# Patient Record
Sex: Female | Born: 1940 | Race: White | Hispanic: No | Smoking: Never smoker
Health system: Southern US, Community
[De-identification: ages and names within clinical notes are randomized; demographics above are authoritative.]

## PROBLEM LIST (undated history)

## (undated) DIAGNOSIS — C801 Malignant (primary) neoplasm, unspecified: Secondary | ICD-10-CM

## (undated) DIAGNOSIS — I10 Essential (primary) hypertension: Secondary | ICD-10-CM

## (undated) DIAGNOSIS — E215 Disorder of parathyroid gland, unspecified: Secondary | ICD-10-CM

## (undated) DIAGNOSIS — I639 Cerebral infarction, unspecified: Secondary | ICD-10-CM

## (undated) DIAGNOSIS — I5189 Other ill-defined heart diseases: Secondary | ICD-10-CM

## (undated) HISTORY — PX: MASTECTOMY: SHX3

## (undated) HISTORY — DX: Disorder of parathyroid gland, unspecified: E21.5

## (undated) HISTORY — DX: Cerebral infarction, unspecified: I63.9

## (undated) HISTORY — DX: Other ill-defined heart diseases: I51.89

---

## 2002-06-16 DIAGNOSIS — E215 Disorder of parathyroid gland, unspecified: Secondary | ICD-10-CM

## 2002-06-16 HISTORY — DX: Disorder of parathyroid gland, unspecified: E21.5

## 2004-10-21 ENCOUNTER — Ambulatory Visit: Payer: Self-pay | Admitting: Oncology

## 2004-12-06 ENCOUNTER — Ambulatory Visit: Payer: Self-pay | Admitting: Oncology

## 2005-02-14 ENCOUNTER — Ambulatory Visit: Payer: Self-pay | Admitting: Oncology

## 2005-04-10 ENCOUNTER — Ambulatory Visit: Payer: Self-pay | Admitting: Oncology

## 2005-05-28 ENCOUNTER — Ambulatory Visit: Payer: Self-pay | Admitting: Oncology

## 2005-07-31 ENCOUNTER — Ambulatory Visit: Payer: Self-pay | Admitting: Oncology

## 2005-10-02 ENCOUNTER — Ambulatory Visit: Payer: Self-pay | Admitting: Oncology

## 2005-12-04 ENCOUNTER — Ambulatory Visit: Payer: Self-pay | Admitting: Oncology

## 2005-12-25 ENCOUNTER — Ambulatory Visit: Payer: Self-pay | Admitting: Oncology

## 2006-02-26 ENCOUNTER — Ambulatory Visit: Payer: Self-pay | Admitting: Oncology

## 2006-05-21 ENCOUNTER — Ambulatory Visit: Payer: Self-pay | Admitting: Oncology

## 2006-08-13 ENCOUNTER — Ambulatory Visit: Payer: Self-pay | Admitting: Oncology

## 2006-11-05 ENCOUNTER — Ambulatory Visit: Payer: Self-pay | Admitting: Oncology

## 2007-01-28 ENCOUNTER — Ambulatory Visit: Payer: Self-pay | Admitting: Oncology

## 2017-01-15 DIAGNOSIS — M8580 Other specified disorders of bone density and structure, unspecified site: Secondary | ICD-10-CM | POA: Diagnosis not present

## 2017-01-15 DIAGNOSIS — Z853 Personal history of malignant neoplasm of breast: Secondary | ICD-10-CM | POA: Diagnosis not present

## 2017-02-25 ENCOUNTER — Encounter (HOSPITAL_COMMUNITY)
Admission: EM | Disposition: A | Discharge status: Home Health | Payer: Self-pay | Source: Home / Self Care | Attending: Neurology

## 2017-02-25 ENCOUNTER — Encounter (HOSPITAL_COMMUNITY): Payer: Self-pay | Admitting: Anesthesiology

## 2017-02-25 ENCOUNTER — Inpatient Hospital Stay (HOSPITAL_COMMUNITY)
Admission: EM | Admit: 2017-02-25 | Discharge: 2017-03-02 | DRG: 062 | Disposition: A | Discharge status: Home Health | Payer: Medicare HMO | Attending: Neurology | Admitting: Neurology

## 2017-02-25 ENCOUNTER — Inpatient Hospital Stay (HOSPITAL_COMMUNITY): Payer: Medicare HMO

## 2017-02-25 ENCOUNTER — Emergency Department (HOSPITAL_COMMUNITY): Payer: Medicare HMO

## 2017-02-25 ENCOUNTER — Encounter (HOSPITAL_COMMUNITY): Payer: Self-pay | Admitting: Emergency Medicine

## 2017-02-25 DIAGNOSIS — I712 Thoracic aortic aneurysm, without rupture: Secondary | ICD-10-CM | POA: Diagnosis present

## 2017-02-25 DIAGNOSIS — E785 Hyperlipidemia, unspecified: Secondary | ICD-10-CM | POA: Diagnosis present

## 2017-02-25 DIAGNOSIS — Z9011 Acquired absence of right breast and nipple: Secondary | ICD-10-CM

## 2017-02-25 DIAGNOSIS — I63311 Cerebral infarction due to thrombosis of right middle cerebral artery: Secondary | ICD-10-CM | POA: Diagnosis present

## 2017-02-25 DIAGNOSIS — Z79899 Other long term (current) drug therapy: Secondary | ICD-10-CM

## 2017-02-25 DIAGNOSIS — Z853 Personal history of malignant neoplasm of breast: Secondary | ICD-10-CM | POA: Diagnosis not present

## 2017-02-25 DIAGNOSIS — R414 Neurologic neglect syndrome: Secondary | ICD-10-CM | POA: Diagnosis present

## 2017-02-25 DIAGNOSIS — I700 Atherosclerosis of aorta: Secondary | ICD-10-CM | POA: Diagnosis present

## 2017-02-25 DIAGNOSIS — R29810 Facial weakness: Secondary | ICD-10-CM | POA: Diagnosis present

## 2017-02-25 DIAGNOSIS — R482 Apraxia: Secondary | ICD-10-CM | POA: Diagnosis present

## 2017-02-25 DIAGNOSIS — I639 Cerebral infarction, unspecified: Secondary | ICD-10-CM | POA: Diagnosis not present

## 2017-02-25 DIAGNOSIS — R4189 Other symptoms and signs involving cognitive functions and awareness: Secondary | ICD-10-CM | POA: Diagnosis present

## 2017-02-25 DIAGNOSIS — I5030 Unspecified diastolic (congestive) heart failure: Secondary | ICD-10-CM | POA: Diagnosis present

## 2017-02-25 DIAGNOSIS — M791 Myalgia: Secondary | ICD-10-CM | POA: Diagnosis present

## 2017-02-25 DIAGNOSIS — R4781 Slurred speech: Secondary | ICD-10-CM | POA: Diagnosis present

## 2017-02-25 DIAGNOSIS — I63411 Cerebral infarction due to embolism of right middle cerebral artery: Secondary | ICD-10-CM | POA: Diagnosis present

## 2017-02-25 DIAGNOSIS — Y92230 Patient room in hospital as the place of occurrence of the external cause: Secondary | ICD-10-CM | POA: Diagnosis not present

## 2017-02-25 DIAGNOSIS — X58XXXA Exposure to other specified factors, initial encounter: Secondary | ICD-10-CM | POA: Diagnosis not present

## 2017-02-25 DIAGNOSIS — I459 Conduction disorder, unspecified: Secondary | ICD-10-CM | POA: Diagnosis present

## 2017-02-25 DIAGNOSIS — I638 Other cerebral infarction: Secondary | ICD-10-CM | POA: Diagnosis not present

## 2017-02-25 DIAGNOSIS — I5189 Other ill-defined heart diseases: Secondary | ICD-10-CM

## 2017-02-25 DIAGNOSIS — G8194 Hemiplegia, unspecified affecting left nondominant side: Secondary | ICD-10-CM | POA: Diagnosis present

## 2017-02-25 DIAGNOSIS — R29717 NIHSS score 17: Secondary | ICD-10-CM | POA: Diagnosis present

## 2017-02-25 DIAGNOSIS — S50811A Abrasion of right forearm, initial encounter: Secondary | ICD-10-CM | POA: Diagnosis not present

## 2017-02-25 DIAGNOSIS — I10 Essential (primary) hypertension: Secondary | ICD-10-CM | POA: Diagnosis present

## 2017-02-25 DIAGNOSIS — I6300 Cerebral infarction due to thrombosis of unspecified precerebral artery: Secondary | ICD-10-CM | POA: Diagnosis not present

## 2017-02-25 DIAGNOSIS — I361 Nonrheumatic tricuspid (valve) insufficiency: Secondary | ICD-10-CM

## 2017-02-25 DIAGNOSIS — I519 Heart disease, unspecified: Secondary | ICD-10-CM | POA: Diagnosis not present

## 2017-02-25 DIAGNOSIS — E876 Hypokalemia: Secondary | ICD-10-CM

## 2017-02-25 DIAGNOSIS — T148XXA Other injury of unspecified body region, initial encounter: Secondary | ICD-10-CM

## 2017-02-25 HISTORY — DX: Cerebral infarction, unspecified: I63.9

## 2017-02-25 HISTORY — DX: Malignant (primary) neoplasm, unspecified: C80.1

## 2017-02-25 HISTORY — DX: Essential (primary) hypertension: I10

## 2017-02-25 LAB — I-STAT CHEM 8, ED
BUN: 13 mg/dL (ref 6–20)
CREATININE: 0.7 mg/dL (ref 0.44–1.00)
Calcium, Ion: 1.15 mmol/L (ref 1.15–1.40)
Chloride: 106 mmol/L (ref 101–111)
GLUCOSE: 135 mg/dL — AB (ref 65–99)
HEMATOCRIT: 43 % (ref 36.0–46.0)
HEMOGLOBIN: 14.6 g/dL (ref 12.0–15.0)
Potassium: 3.3 mmol/L — ABNORMAL LOW (ref 3.5–5.1)
Sodium: 141 mmol/L (ref 135–145)
TCO2: 21 mmol/L — AB (ref 22–32)

## 2017-02-25 LAB — COMPREHENSIVE METABOLIC PANEL
ALT: 21 U/L (ref 14–54)
AST: 23 U/L (ref 15–41)
Albumin: 4 g/dL (ref 3.5–5.0)
Alkaline Phosphatase: 87 U/L (ref 38–126)
Anion gap: 10 (ref 5–15)
BUN: 12 mg/dL (ref 6–20)
CALCIUM: 9.3 mg/dL (ref 8.9–10.3)
CO2: 20 mmol/L — ABNORMAL LOW (ref 22–32)
CREATININE: 0.8 mg/dL (ref 0.44–1.00)
Chloride: 108 mmol/L (ref 101–111)
Glucose, Bld: 134 mg/dL — ABNORMAL HIGH (ref 65–99)
Potassium: 3.4 mmol/L — ABNORMAL LOW (ref 3.5–5.1)
SODIUM: 138 mmol/L (ref 135–145)
Total Bilirubin: 0.7 mg/dL (ref 0.3–1.2)
Total Protein: 7 g/dL (ref 6.5–8.1)

## 2017-02-25 LAB — DIFFERENTIAL
BASOS ABS: 0 10*3/uL (ref 0.0–0.1)
Basophils Relative: 0 %
EOS ABS: 0.3 10*3/uL (ref 0.0–0.7)
Eosinophils Relative: 2 %
LYMPHS ABS: 4.5 10*3/uL — AB (ref 0.7–4.0)
LYMPHS PCT: 36 %
Monocytes Absolute: 1.1 10*3/uL — ABNORMAL HIGH (ref 0.1–1.0)
Monocytes Relative: 9 %
NEUTROS ABS: 6.6 10*3/uL (ref 1.7–7.7)
NEUTROS PCT: 53 %

## 2017-02-25 LAB — CBC
HCT: 42.1 % (ref 36.0–46.0)
HEMOGLOBIN: 14.1 g/dL (ref 12.0–15.0)
MCH: 29.9 pg (ref 26.0–34.0)
MCHC: 33.5 g/dL (ref 30.0–36.0)
MCV: 89.4 fL (ref 78.0–100.0)
Platelets: 185 10*3/uL (ref 150–400)
RBC: 4.71 MIL/uL (ref 3.87–5.11)
RDW: 13.3 % (ref 11.5–15.5)
WBC: 12.5 10*3/uL — ABNORMAL HIGH (ref 4.0–10.5)

## 2017-02-25 LAB — PROTIME-INR
INR: 0.92
Prothrombin Time: 12.2 seconds (ref 11.4–15.2)

## 2017-02-25 LAB — I-STAT TROPONIN, ED: TROPONIN I, POC: 0.03 ng/mL (ref 0.00–0.08)

## 2017-02-25 LAB — ECHOCARDIOGRAM COMPLETE: Weight: 2631.41 oz

## 2017-02-25 LAB — MRSA PCR SCREENING: MRSA by PCR: NEGATIVE

## 2017-02-25 LAB — CBG MONITORING, ED: Glucose-Capillary: 130 mg/dL — ABNORMAL HIGH (ref 65–99)

## 2017-02-25 LAB — APTT: APTT: 22 s — AB (ref 24–36)

## 2017-02-25 SURGERY — RADIOLOGY WITH ANESTHESIA
Anesthesia: General

## 2017-02-25 MED ORDER — SODIUM CHLORIDE 0.9 % IV SOLN: 50.0000 mL | Freq: Once | INTRAVENOUS | Status: AC

## 2017-02-25 MED ORDER — ATENOLOL 25 MG PO TABS
12.5000 mg | ORAL_TABLET | Freq: Every day | ORAL | Status: DC
  Administered 2017-02-26 – 2017-03-01 (×4): 12.5 mg via ORAL
  Filled 2017-02-25: qty 0.5
  Filled 2017-02-25: qty 1
  Filled 2017-02-25: qty 0.5
  Filled 2017-02-25: qty 1
  Filled 2017-02-25: qty 0.5
  Filled 2017-02-25 (×2): qty 1

## 2017-02-25 MED ORDER — ACETAMINOPHEN 325 MG PO TABS
650.0000 mg | ORAL_TABLET | ORAL | Status: DC | PRN
  Administered 2017-03-01: 650 mg via ORAL
  Filled 2017-02-25: qty 2

## 2017-02-25 MED ORDER — ACETAMINOPHEN 650 MG RE SUPP: 650.0000 mg | RECTAL | Status: DC | PRN

## 2017-02-25 MED ORDER — DIPHENHYDRAMINE HCL 50 MG/ML IJ SOLN
INTRAMUSCULAR | Status: AC
  Filled 2017-02-25: qty 1

## 2017-02-25 MED ORDER — ALTEPLASE (STROKE) FULL DOSE INFUSION
0.9000 mg/kg | Freq: Once | INTRAVENOUS | Status: AC
  Administered 2017-02-25: 67 mg via INTRAVENOUS
  Filled 2017-02-25: qty 100

## 2017-02-25 MED ORDER — NITROGLYCERIN 1 MG/10 ML FOR IR/CATH LAB
INTRA_ARTERIAL | Status: AC
  Filled 2017-02-25: qty 10

## 2017-02-25 MED ORDER — IOPAMIDOL (ISOVUE-370) INJECTION 76%
INTRAVENOUS | Status: AC
  Filled 2017-02-25: qty 50

## 2017-02-25 MED ORDER — LABETALOL HCL 5 MG/ML IV SOLN: 10.0000 mg | INTRAVENOUS | Status: DC | PRN

## 2017-02-25 MED ORDER — STROKE: EARLY STAGES OF RECOVERY BOOK
Freq: Once | Status: AC
  Filled 2017-02-25 (×2): qty 1

## 2017-02-25 MED ORDER — ONDANSETRON HCL 4 MG/2ML IJ SOLN: 4.0000 mg | Freq: Four times a day (QID) | INTRAMUSCULAR | Status: DC | PRN

## 2017-02-25 MED ORDER — EPTIFIBATIDE 20 MG/10ML IV SOLN
INTRAVENOUS | Status: AC
  Filled 2017-02-25: qty 10

## 2017-02-25 MED ORDER — PANTOPRAZOLE SODIUM 40 MG IV SOLR
40.0000 mg | Freq: Every day | INTRAVENOUS | Status: DC
  Administered 2017-02-25: 40 mg via INTRAVENOUS
  Filled 2017-02-25: qty 40

## 2017-02-25 MED ORDER — ACETAMINOPHEN 160 MG/5ML PO SOLN: 650.0000 mg | ORAL | Status: DC | PRN

## 2017-02-25 MED ORDER — POTASSIUM CHLORIDE 10 MEQ/100ML IV SOLN
10.0000 meq | INTRAVENOUS | Status: DC
  Filled 2017-02-25: qty 100

## 2017-02-25 MED ORDER — SENNOSIDES-DOCUSATE SODIUM 8.6-50 MG PO TABS: 1.0000 | ORAL_TABLET | Freq: Every evening | ORAL | Status: DC | PRN

## 2017-02-25 MED ORDER — POTASSIUM CHLORIDE 10 MEQ/100ML IV SOLN
10.0000 meq | INTRAVENOUS | Status: AC
  Administered 2017-02-25 (×2): 10 meq via INTRAVENOUS
  Filled 2017-02-25: qty 100

## 2017-02-25 MED ORDER — SODIUM CHLORIDE 0.9 % IV SOLN
INTRAVENOUS | Status: AC
  Administered 2017-02-25 – 2017-02-26 (×3): via INTRAVENOUS

## 2017-02-25 NOTE — Consult Note (Signed)
Referring Physician: Dr. Dayna Barker    Chief Complaint: Acute onset of left sided weakness and facial droop  HPI: Zoe Andrade is an 76 y.o. female who presented acutely to the ED via EMS as a Code Stroke with sudden onset of left sided weakness, left facial droop, rightward gaze deviation and slurred speech. She woke up this AM in her USOH. Later in the morning while sitting down at a restaurant with her husband she became unresponsive and then, when she started to speak again, the above symptoms were noted. Symptoms began at 0900. She has no prior history of stroke. She is not on an antiplatelet agent or a blood thinner. Her only home medications are Plendil and Tenormin.    LSN: 0900 tPA Given: Yes  No past medical history on file.  No past surgical history on file.  No family history on file. Social History:  has no tobacco, alcohol, and drug history on file.  Allergies: Allergies not on file  Home Medications:  Tenormin  Plendil  ROS: No headache or chest pain. Other ROS as per HPI. Detailed additional ROS deferred due to acuity of presentation.   Physical Examination: There were no vitals taken for this visit.  HEENT: [place]/AT Lungs: Respirations unlabored Ext: No edema  Neurologic Examination: Mental Status: Alert and oriented to self but not situation. Speech dysarthric with some hesitancy, but is fluent without errors of syntax or grammar. Able to follow all right sided commands. Repetition and naming intact. Left hemineglect. Anosognosia regarding left sided symptoms.  Cranial Nerves: II:  Left sided visual field cut. PERRL.  III,IV, VI: Ptosis not present. Rightward conjugate gaze deviation; unable to volitionally cross midline to left. Able to cross midline to left with doll's eye maneuver. No nystagmus.   V,VII: Left facial droop. Decreased sensation to left side of face.  VIII: hearing intact to questions and commands IX,X: No hypophonia or hoarseness XI: Rightward  head rotation.  XII: Tongue deviates slightly to the right when protruded.  Motor: RUE and RLE 5/5 LUE: Intermittent flaccidity with 0/5 strength fluctuating to 3/5 proximal and 2/5 distal strength with coarse, uncoordinated movements LLE: 3/5 proximal and distal Sensory: Absent temp and FT to LUE and LLE.  Deep Tendon Reflexes:  Normoactive on the right. Mildly hyperactive on the left.  Plantars: Right: downgoing   Left: Equivocal Cerebellar: No ataxia with FNF on right. Unable to perform on the left.  Gait: Unable to assess  CTA of head and neck: 1. Right posterior M2 occlusion with marked attenuation of distal MCA branch vessels. 2. M1 segments are intact bilaterally.  3. Atherosclerotic calcifications at the cavernous internal carotid arteries bilaterally without significant stenoses. 4. Tortuosity of the cervical internal carotid arteries bilaterally without significant stenosis. 5. Mild degenerative changes of the cervical spine.   Assessment: 76 y.o. female presenting with acute left sided weakness, left facial droop, rightward gaze deviation and slurred speech.  1. 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 2. CTA findings concordant with exam 3. Stroke Risk Factors - HTN  Plan: 1. The patient is a candidate for IV tPA, with no contraindications. Risks/benefits discussed with patient and additional questions answered. The patient expressed understanding and wish to proceed with tPA.  2. Post-tPA order set including BP management, frequent neuro checks and repeat CT head in 24 hours 3. HgbA1c, fasting lipid panel 4. MRI of the brain without contrast 5. Echocardiogram 6. Cardiac telemetry 7. PT consult,  OT consult, Speech consult 8. No antiplatelet medications or anticoagulants for at least 24 hours following tPA. ASA can be started if repeat CT at 24 hours is negative for hemorrhagic conversion 9. DVT prophylaxis with  SCDs 10. Start atorvastatin 40 mg po qd. Obtain baseline CK level  Addendum: Rapid improvement in Neurological examination with decrease of NIHSS to 3 following initial tPA bolus. Initially was a potential candidate for endovascular treatment. However, given rapid improvement, risks of treatment are felt to outweigh potential benefits. Consensus agreement after discussion between myself, Dr. Earleen Newport and Dr. Estanislado Pandy to hold off on endovascular treatment for now. May require intervention if Neurological exam deteriorates.   50 minutes spent in the emergent Neurological evaluation and management of this critically ill acute stroke patient.   @Electronically  signed: Dr. Kerney Elbe  02/25/2017, 9:51 AM

## 2017-02-25 NOTE — ED Provider Notes (Addendum)
[facility] DEPT Provider Note   CSN: [id number] Arrival date & time: 02/25/17  0944     History   Chief Complaint No chief complaint on file.   HPI Zoe Andrade is a 76 y.o. female.  Patient presents by EMS as a code stroke. Per husband patient is a 76 year old female who was her normal self this morning when she and her husband went out to get gas and then went to a restaurant. Per his account in the restaurant she suddenly became unresponsive and when she started answering questions she was not acting herself. That was 20-30 minutes prior to arrival.  She is having difficulty moving her left side per her husband.  Due to patient's condition, no prior medical records in our system and husband's emotional distress difficult to get further history at this time.   The history is provided by the spouse. The history is limited by the condition of the patient.    No past medical history on file.  There are no active problems to display for this patient.   No past surgical history on file.  OB History    No data available       Home Medications    Prior to Admission medications   Not on File    Family History No family history on file.  Social History Social History  Substance Use Topics  . Smoking status: Not on file  . Smokeless tobacco: Not on file  . Alcohol use Not on file     Allergies   Patient has no allergy information on record.   Review of Systems Review of Systems  Unable to perform ROS: Mental status change     Physical Exam Updated Vital Signs There were no vitals taken for this visit.  Physical Exam  Constitutional: She appears well-developed and well-nourished. No distress.  HENT:  Head: Normocephalic and atraumatic.  Mouth/Throat: Oropharynx is clear and moist.  Airway intact and no difficulty handling secretions  Eyes: Pupils are equal, round, and reactive to light. Conjunctivae and EOM are normal.  Neck: Normal range of  motion. Neck supple.  Cardiovascular: Normal rate, regular rhythm and intact distal pulses.   No murmur heard. Pulmonary/Chest: Effort normal and breath sounds normal. No respiratory distress. She has no wheezes. She has no rales.  Abdominal: Soft. She exhibits no distension. There is no tenderness. There is no rebound and no guarding.  Musculoskeletal: Normal range of motion. She exhibits no edema or tenderness.  Neurological: She is alert.  Unable to cross midline. Clumsy use of the left upper extremity but is able to lift the left leg now but cannot hold. No aphasia.  Left-sided neglect. Decreased sensation  Skin: Skin is warm and dry. No rash noted. No erythema.  Psychiatric: She has a normal mood and affect. Her behavior is normal.  Nursing note and vitals reviewed.    ED Treatments / Results  Labs (all labs ordered are listed, but only abnormal results are displayed) Labs Reviewed  APTT - Abnormal; Notable for the following:       Result Value   aPTT 22 (*)    All other components within normal limits  CBC - Abnormal; Notable for the following:    WBC 12.5 (*)    All other components within normal limits  DIFFERENTIAL - Abnormal; Notable for the following:    Lymphs Abs 4.5 (*)    Monocytes Absolute 1.1 (*)    All other components within normal limits  COMPREHENSIVE METABOLIC PANEL - Abnormal; Notable for the following:    Potassium 3.4 (*)    CO2 20 (*)    Glucose, Bld 134 (*)    All other components within normal limits  CBG MONITORING, ED - Abnormal; Notable for the following:    Glucose-Capillary 130 (*)    All other components within normal limits  I-STAT CHEM 8, ED - Abnormal; Notable for the following:    Potassium 3.3 (*)    Glucose, Bld 135 (*)    TCO2 21 (*)    All other components within normal limits  PROTIME-INR  I-STAT TROPONIN, ED    EKG  EKG Interpretation None       Radiology Ct Angio Head W Or Wo Contrast  Result Date:  02/25/2017 CLINICAL DATA:  Focal neuro deficit, less than 6 hours, stroke suspected. Acute onset of left-sided weakness beginning at 9 a.m. today. Abnormal CT of the head without contrast. EXAM: CT ANGIOGRAPHY HEAD AND NECK TECHNIQUE: Multidetector CT imaging of the head and neck was performed using the standard protocol during bolus administration of intravenous contrast. Multiplanar CT image reconstructions and MIPs were obtained to evaluate the vascular anatomy. Carotid stenosis measurements (when applicable) are obtained utilizing NASCET criteria, using the distal internal carotid diameter as the denominator. CONTRAST:  50 mL Isovue 370 COMPARISON:  CT head without contrast 02/26/2015. FINDINGS: CTA NECK FINDINGS Aortic arch: A 3 vessel arch configuration is present. Atherosclerotic calcifications are distal to the subclavian artery without significant stenosis or aneurysm. Right carotid system: The right common carotid artery is within normal limits. The right carotid bifurcation is unremarkable. There is tortuosity of the distal cervical right ICA without significant stenosis. Left carotid system: The left common carotid artery is within normal limits. Bifurcation is unremarkable. Mild tortuosity is present in the mid cervical left internal carotid artery without significant stenosis. Vertebral arteries: Vertebral artery is originate from the subclavian arteries bilaterally without significant stenosis. Moderate tortuosity is present proximally without significant stenosis. The vertebral arteries are codominant. There is no significant stenosis of either vertebral artery in the neck. Skeleton: Degenerative endplate changes are most prominent at C5-6 on the right at C3-4 on the left. Vertebral body heights and alignment are maintained. No focal lytic or blastic lesions are present. The patient is edentulous. Other neck: No significant mucosal or submucosal lesions are present. Salivary glands are within normal  limits. There is no significant adenopathy. Upper chest: Mild dependent atelectasis is present at the lung apices bilaterally. No focal nodule or mass lesion is present. There is no significant airspace disease. The superior mediastinum is otherwise unremarkable. Review of the MIP images confirms the above findings CTA HEAD FINDINGS Anterior circulation: Atherosclerotic calcifications are presents within main cavernous internal carotid arteries bilaterally without significant stenosis. The terminal ICA is normal bilaterally. The A1 and M1 segments are intact. A proximal posterior right M2 occlusion is present. This limits opacification of posterior MCA branch vessels. Anterior MCA branch vessels are intact. ACA branch vessels are normal. Left MCA branch vessels are normal. Posterior circulation: The vertebral arteries are codominant. Atherosclerotic changes are present at the dural margins. The vertebrobasilar junction is normal. The basilar artery is normal. Both posterior cerebral arteries originate from basilar tip. PCA branch vessels are within normal limits. Venous sinuses: The dural sinuses are patent. The straight sinus deep cerebral veins are intact. The right transverse sinus is dominant. Anatomic variants: None Delayed phase: Not perform Review of the MIP images confirms the above  findings IMPRESSION: 1. Right posterior M2 occlusion with marked attenuation of distal MCA branch vessels. 2. M1 segments are intact bilaterally. 3. Atherosclerotic calcifications at the cavernous internal carotid arteries bilaterally without significant stenoses. 4. Tortuosity of the cervical internal carotid arteries bilaterally without significant stenosis. 5. Mild degenerative changes of the cervical spine. Dr. Damita Dunnings discussed the findings of this study directly with Dr. Cheral Marker at the time of the exam. Electronically Signed   By: San Morelle M.D.   On: 02/25/2017 10:56   Ct Angio Neck W Or Wo  Contrast  Result Date: 02/25/2017 CLINICAL DATA:  Focal neuro deficit, less than 6 hours, stroke suspected. Acute onset of left-sided weakness beginning at 9 a.m. today. Abnormal CT of the head without contrast. EXAM: CT ANGIOGRAPHY HEAD AND NECK TECHNIQUE: Multidetector CT imaging of the head and neck was performed using the standard protocol during bolus administration of intravenous contrast. Multiplanar CT image reconstructions and MIPs were obtained to evaluate the vascular anatomy. Carotid stenosis measurements (when applicable) are obtained utilizing NASCET criteria, using the distal internal carotid diameter as the denominator. CONTRAST:  50 mL Isovue 370 COMPARISON:  CT head without contrast 02/26/2015. FINDINGS: CTA NECK FINDINGS Aortic arch: A 3 vessel arch configuration is present. Atherosclerotic calcifications are distal to the subclavian artery without significant stenosis or aneurysm. Right carotid system: The right common carotid artery is within normal limits. The right carotid bifurcation is unremarkable. There is tortuosity of the distal cervical right ICA without significant stenosis. Left carotid system: The left common carotid artery is within normal limits. Bifurcation is unremarkable. Mild tortuosity is present in the mid cervical left internal carotid artery without significant stenosis. Vertebral arteries: Vertebral artery is originate from the subclavian arteries bilaterally without significant stenosis. Moderate tortuosity is present proximally without significant stenosis. The vertebral arteries are codominant. There is no significant stenosis of either vertebral artery in the neck. Skeleton: Degenerative endplate changes are most prominent at C5-6 on the right at C3-4 on the left. Vertebral body heights and alignment are maintained. No focal lytic or blastic lesions are present. The patient is edentulous. Other neck: No significant mucosal or submucosal lesions are present. Salivary  glands are within normal limits. There is no significant adenopathy. Upper chest: Mild dependent atelectasis is present at the lung apices bilaterally. No focal nodule or mass lesion is present. There is no significant airspace disease. The superior mediastinum is otherwise unremarkable. Review of the MIP images confirms the above findings CTA HEAD FINDINGS Anterior circulation: Atherosclerotic calcifications are presents within main cavernous internal carotid arteries bilaterally without significant stenosis. The terminal ICA is normal bilaterally. The A1 and M1 segments are intact. A proximal posterior right M2 occlusion is present. This limits opacification of posterior MCA branch vessels. Anterior MCA branch vessels are intact. ACA branch vessels are normal. Left MCA branch vessels are normal. Posterior circulation: The vertebral arteries are codominant. Atherosclerotic changes are present at the dural margins. The vertebrobasilar junction is normal. The basilar artery is normal. Both posterior cerebral arteries originate from basilar tip. PCA branch vessels are within normal limits. Venous sinuses: The dural sinuses are patent. The straight sinus deep cerebral veins are intact. The right transverse sinus is dominant. Anatomic variants: None Delayed phase: Not perform Review of the MIP images confirms the above findings IMPRESSION: 1. Right posterior M2 occlusion with marked attenuation of distal MCA branch vessels. 2. M1 segments are intact bilaterally. 3. Atherosclerotic calcifications at the cavernous internal carotid arteries bilaterally without significant  stenoses. 4. Tortuosity of the cervical internal carotid arteries bilaterally without significant stenosis. 5. Mild degenerative changes of the cervical spine. Dr. Damita Dunnings discussed the findings of this study directly with Dr. Cheral Marker at the time of the exam. Electronically Signed   By: San Morelle M.D.   On: 02/25/2017 10:56   Ct Head  Code Stroke Wo Contrast  Result Date: 02/25/2017 CLINICAL DATA:  Code stroke.  Left-sided weakness, onset at 9 a.m. EXAM: CT HEAD WITHOUT CONTRAST TECHNIQUE: Contiguous axial images were obtained from the base of the skull through the vertex without intravenous contrast. COMPARISON:  None. FINDINGS: Brain: No evidence of acute infarction, hemorrhage, hydrocephalus, extra-axial collection or mass lesion/mass effect. Vascular: Atherosclerotic calcification. Possible dense right M1 segment. Skull: No acute or aggressive finding. Sinuses/Orbits: Left sphenoid sinusitis with retained secretions. Other: Text page with prelim sent 02/25/2017 at 9:58 am to Dr. Cheral Marker. Awaiting call back. ASPECTS [facility] Stroke Program Early CT Score) - Ganglionic level infarction (caudate, lentiform nuclei, internal capsule, insula, M1-M3 cortex): 7 - Supraganglionic infarction (M4-M6 cortex): 3 Total score (0-10 with 10 being normal): 10 IMPRESSION: Possible dense/thrombosed right MCA. ASPECTS is 10. No acute hemorrhage. Electronically Signed   By: Monte Fantasia M.D.   On: 02/25/2017 10:03    Procedures Procedures (including critical care time)  Medications Ordered in ED Medications  iopamidol (ISOVUE-370) 76 % injection (not administered)     Initial Impression / Assessment and Plan / ED Course  I have reviewed the triage vital signs and the nursing notes.  Pertinent labs & imaging results that were available during my care of the patient were reviewed by me and considered in my medical decision making (see chart for details).     Patient presenting as a code stroke in the window LSN approx 74min with significant left-sided deficits and concern for large vessel occlusion. Patient went directly to the CT scanner airway was intact. Patient was found to have a dense thrombosis of her right MCA which was discussed with the patient and her husband and she was taken to IR for thrombi lysis.  NIH of 17. VAN score with  neglect Pt to receive TPA  11:02 AM Pt received first dose of TPA and had significant improvement and IR was cancelled.   CRITICAL CARE Performed by: Blanchie Dessert Total critical care time: 30 minutes Critical care time was exclusive of separately billable procedures and treating other patients. Critical care was necessary to treat or prevent imminent or life-threatening deterioration. Critical care was time spent personally by me on the following activities: development of treatment plan with patient and/or surrogate as well as nursing, discussions with consultants, evaluation of patient's response to treatment, examination of patient, obtaining history from patient or surrogate, ordering and performing treatments and interventions, ordering and review of laboratory studies, ordering and review of radiographic studies, pulse oximetry and re-evaluation of patient's condition.   Final Clinical Impressions(s) / ED Diagnoses   Final diagnoses:  Cerebrovascular accident (CVA) due to thrombosis of right middle cerebral artery [facility])    New Prescriptions New Prescriptions   No medications on file     Blanchie Dessert, MD 02/25/17 1014    Blanchie Dessert, MD 02/25/17 Max Meadows, MD 02/25/17 1102

## 2017-02-25 NOTE — Progress Notes (Signed)
Patient is presenting with significant left facial droop, slight confusion, and slurred speech. BP stable. Dr. Cheral Marker made aware of changes. Will continue to monitor. Thayer Ohm D

## 2017-02-25 NOTE — ED Triage Notes (Signed)
Pt arrives from home via [facility] as code stroke, L sided neglect, facial droop.  EMS reports LSN 0900. Pt sitting on couch with spouse, fell onto floor,  No LOC.  Pt alert on arrival, airway intact.

## 2017-02-25 NOTE — Code Documentation (Addendum)
76yo female arriving to [facility] via [facility] at [phone].  Patient from home where she had returned home from getting gas.  Patient's husband reports she was at her baseline at that time when she went to sit on the couch.  LKW 0900.  She proceeded to suddenly fall off the couch.  EMS was called and assessed left hemiplegia, right gaze preference and slurred speech and activated a code stroke.  Patient with h/o breast cancer, right breast mastectomy and HTN.  Stroke team at the bedside on patient arrival.  Labs drawn and patient cleared for CT by Dr. Dayna Barker.  Patient to CT with team.  CT completed.  NIHSS 17, see documentation for details and code stroke times.  Patient with right gaze preference, left hemianopia, severe left hemiparesis, left sided neglect and absent sensation, facial droop and dysarthria on exam.  VAN+. Foley catheter placed.  Pharmacy at the bedside to mix tPA.  tPA 7mg  bolus given at 1007 over 1 minute followed by 60mg /hr for a total of 67mg  per pharmacy dosing.  CTA head and neck completed showing right posterior M2 occlusion with marked attenuation of distal MCA branch vessels.  Patient reassessed with marked improvement, NIHSS now 4, see documentation for details.  MD decision not to proceed with endovascular intervention at that time d/t patient improvement.  Of note, patient anxious with continuous requests to go home requiring redirection and explanation.  Patient transferred to ED room.  Patient's husband tearful and anxious requiring chaplain support.  Chaplain escorted patient's husband to cafeteria and then to 4N waiting room.  Patient monitored frequently per post-tPA protocol.  NIHSS improved to 1 with only minor facial weakness on exam.  tPA infusion completed and NS flush hung.  Patient transferred to 4N28 by ED RN and Stroke RN.  Patient reassessed at the bedside with [facility], RN, no changes in exam.  Patient with improvement in anxiety and understanding of need for admission.  Bedside  handoff with Brayton Layman, RN.

## 2017-02-25 NOTE — Progress Notes (Signed)
   02/25/17 1500  Clinical Encounter Type  Visited With Patient and family together  Visit Type Follow-up  Referral From Chaplain  Consult/Referral To Chaplain  Spiritual Encounters  Spiritual Needs Emotional  Stress Factors  Patient Stress Factors None identified  Family Stress Factors Family relationships;Exhausted;Major life changes  Had an opportunity to speak with the husband of the PT.  He was having difficulty experiencing his wife in the condition that she was in.  He was thankful that he called the EMS when he did or the PT could have been in worse shape.  Crying, husband expressed how he has spent most of his life with his wife and she was his best friend.  2 daughters were present in the room and working to keep mom still while was putting a line in.  Husband shared memories with chaplain and could tell he was having a hard time with the situation.  I encouraged the husband that his strength was needed for his wife and their daughters.  I spoke with husband about the anticipatory grief he was feeling and maybe his outlook would be different if he anticipated another feeling.

## 2017-02-25 NOTE — ED Provider Notes (Signed)
MSE was initiated and I personally evaluated the patient and placed orders (if any) at  9:47 AM on February 25, 2017.  The patient's airway appears stable, orders placed for Code Stroke to get CT and the remainder of the MSE may be completed by another provider.   Kadasia Kassing, Corene Cornea, MD 02/25/17 [id number]

## 2017-02-25 NOTE — Progress Notes (Signed)
  Echocardiogram 2D Echocardiogram has been performed.  Anayansi Rundquist L Androw 02/25/2017, 2:29 PM

## 2017-02-25 NOTE — Progress Notes (Signed)
Responded to page from Ed to support patient's husband who was sitting in CT conference room tearful and afraid. Per nurse pt arrives from home via [place] as code stroke.  Pt was sitting on couch with spouse suddenly fell onto floor. Pt alert on arrival.  Patient husband Richardson Landry) is having much difficulty coping. Spouse is supported by neighbor who said she would remain with him until daughters arrive from out of state. One daughter coming from [facility] caught in traffic. Both daughters Jeannene Patella [phone] and  Maudie Mercury [phone] or [phone]) are in route to hospital.  EDP and Neurologist spoke with husband and both daughters.  I provided empathetic listening, hospitality and emotional support to patients husband and friend. Supported Biochemist, clinical and facilitated information sharing between staff and family.  Escorted husband to 4N waiting area and made him aware as to what to expect next.  Will pass on to unit chaplain for continued support.   02/25/17 1115  Clinical Encounter Type  Visited With Patient;Family;Patient and family together;Health care provider  Visit Type Initial;Spiritual support;Social support;ED  Referral From Nurse  Spiritual Encounters  Spiritual Needs Emotional  Stress Factors  Patient Stress Factors Health changes  Family Stress Factors Exhausted  Cristopher Peru, [facility], Pager [phone]

## 2017-02-25 NOTE — Progress Notes (Signed)
NeuroInterventional Radiology Pre-Procedure Note  History: 76 yo female arrives via ED with acute dense left sided symptoms.  Baseline is normal (mRS = 0), with baseline NIHSS of 17.    Significant improvement of symptoms with near complete resolution after tPA administration with repeat NIHSS of 3. The improvement occurred in the CT department, on the CT table.    CT ASPECTS: 10. CTA:   .M2 occlusion  Discussed with neurology.  Given new baseline of NIHSS 3, and well within the 6 hour window, will admit to ICU for frequent neuro checks and continue to monitor.    If her neuro exam were to deteriorate above NIHSS of 6, she remains a candidate for mechanical thrombectomy up to 6 hours without advanced imaging.  If deterioration were to occur after 6 hours, advanced neuro imaging with either CT perfusion or MR would be suggested before endovascular treatment.   Discussed with the family.    Call with questions/concerns.   Signed,  Dulcy Fanny. Earleen Newport, DO

## 2017-02-26 ENCOUNTER — Encounter (HOSPITAL_COMMUNITY): Payer: Self-pay | Admitting: Neurology

## 2017-02-26 ENCOUNTER — Inpatient Hospital Stay (HOSPITAL_COMMUNITY): Payer: Medicare HMO

## 2017-02-26 DIAGNOSIS — E785 Hyperlipidemia, unspecified: Secondary | ICD-10-CM

## 2017-02-26 LAB — BASIC METABOLIC PANEL
ANION GAP: 10 (ref 5–15)
BUN: 6 mg/dL (ref 6–20)
CO2: 20 mmol/L — ABNORMAL LOW (ref 22–32)
Calcium: 9.1 mg/dL (ref 8.9–10.3)
Chloride: 105 mmol/L (ref 101–111)
Creatinine, Ser: 0.68 mg/dL (ref 0.44–1.00)
GFR calc Af Amer: >60 mL/min (ref >60)
Glucose, Bld: 105 mg/dL — ABNORMAL HIGH (ref 65–99)
POTASSIUM: 3.5 mmol/L (ref 3.5–5.1)
SODIUM: 135 mmol/L (ref 135–145)

## 2017-02-26 LAB — CBC
HCT: 41.5 % (ref 36.0–46.0)
Hemoglobin: 14.2 g/dL (ref 12.0–15.0)
MCH: 30.5 pg (ref 26.0–34.0)
MCHC: 34.2 g/dL (ref 30.0–36.0)
MCV: 89.1 fL (ref 78.0–100.0)
PLATELETS: 173 10*3/uL (ref 150–400)
RBC: 4.66 MIL/uL (ref 3.87–5.11)
RDW: 13.2 % (ref 11.5–15.5)
WBC: 10.7 10*3/uL — ABNORMAL HIGH (ref 4.0–10.5)

## 2017-02-26 LAB — LIPID PANEL
CHOL/HDL RATIO: 3.6 ratio
Cholesterol: 186 mg/dL (ref 0–200)
HDL: 51 mg/dL (ref >40)
LDL Cholesterol: 115 mg/dL — ABNORMAL HIGH (ref 0–99)
Triglycerides: 101 mg/dL (ref <150)
VLDL: 20 mg/dL (ref 0–40)

## 2017-02-26 LAB — HEMOGLOBIN A1C
Hgb A1c MFr Bld: 5.3 % (ref 4.8–5.6)
MEAN PLASMA GLUCOSE: 105.41 mg/dL

## 2017-02-26 MED ORDER — PANTOPRAZOLE SODIUM 40 MG PO TBEC
40.0000 mg | DELAYED_RELEASE_TABLET | Freq: Every day | ORAL | Status: DC
  Administered 2017-02-26 – 2017-03-01 (×4): 40 mg via ORAL
  Filled 2017-02-26 (×5): qty 1

## 2017-02-26 MED ORDER — ATORVASTATIN CALCIUM 10 MG PO TABS
20.0000 mg | ORAL_TABLET | Freq: Every day | ORAL | Status: DC
  Administered 2017-02-26 – 2017-03-02 (×5): 20 mg via ORAL
  Filled 2017-02-26 (×4): qty 2
  Filled 2017-02-26: qty 1

## 2017-02-26 MED ORDER — SODIUM CHLORIDE 0.9 % IV SOLN
INTRAVENOUS | Status: AC
  Administered 2017-02-26: 20:00:00 via INTRAVENOUS

## 2017-02-26 MED ORDER — ASPIRIN EC 325 MG PO TBEC
325.0000 mg | DELAYED_RELEASE_TABLET | Freq: Every day | ORAL | Status: DC
  Administered 2017-02-26 – 2017-03-01 (×4): 325 mg via ORAL
  Filled 2017-02-26 (×5): qty 1

## 2017-02-26 MED ORDER — DIPHENHYDRAMINE HCL 25 MG PO CAPS
50.0000 mg | ORAL_CAPSULE | Freq: Once | ORAL | Status: AC
  Administered 2017-02-26: 50 mg via ORAL
  Filled 2017-02-26: qty 2

## 2017-02-26 MED ORDER — ENOXAPARIN SODIUM 40 MG/0.4ML ~~LOC~~ SOLN
40.0000 mg | SUBCUTANEOUS | Status: DC
  Administered 2017-02-26 – 2017-03-01 (×4): 40 mg via SUBCUTANEOUS
  Filled 2017-02-26 (×4): qty 0.4

## 2017-02-26 NOTE — Progress Notes (Signed)
Rehab Admissions Coordinator Note:  Patient was screened by Cleatrice Burke for appropriateness for an Inpatient Acute Rehab Consult per PT recommendation.  At this time, we are recommending Inpatient Rehab consult.  Cleatrice Burke 02/26/2017, 3:50 PM  I can be reached at [phone].

## 2017-02-26 NOTE — Progress Notes (Signed)
Occupational Therapy Evaluation Patient Details Name: Zoe Andrade MRN: [id number] DOB: 1940/12/24 Today's Date: 02/26/2017    History of Present Illness (P) 76 y.o. female who presented acutely to the ED via EMS as a Code Stroke with sudden onset of left sided weakness, left facial droop, rightward gaze deviation and slurred speech. baseline NIHSS of 17. tPA given. new baseline of NIHSS 3. MRI + No improvement in the posterior right MCA M2 branch occlusion and other MCA branches; R posterior MCA  infarct, extending posteriorly    Clinical Impression   PTA, pt independent with ADL and mobility and was very active. Pt demonstrates deficits with visualcperceptual skills, cognition, sensorimotor control of LUE in addition to L inattention, possible L visual field deficit in addition to deficits listed below. Pt currently requires mod A with mobility and ADL tasks. Pt is an excellent CIR candidate and has a very supportive family. Will follow acutely to address established goals and facilitate DC to next venue of care.     Follow Up Recommendations  CIR;Supervision/Assistance - 24 hour    Equipment Recommendations  3 in 1 bedside commode    Recommendations for Other Services Rehab consult     Precautions / Restrictions Precautions Precautions: (P) Fall Precaution Comments: (P) poor awareness Restrictions Weight Bearing Restrictions: (P) No      Mobility Bed Mobility Overal bed mobility: (P) Needs Assistance Bed Mobility: (P) Supine to Sit     Supine to sit: (P) Min assist        Transfers Overall transfer level: (P) Needs assistance   Transfers: (P) Sit to/from Stand;Stand Pivot Transfers Sit to Stand: (P) Min assist Stand pivot transfers: (P) Mod assist       General transfer comment: (P) L bias    Balance Overall balance assessment: (P) Needs assistance   Sitting balance-Leahy Scale: (P) Fair   Postural control: (P) Posterior lean;Left lateral lean (when  distracted)   Standing balance-Leahy Scale: (P) Poor Standing balance comment: (P) L bias                           ADL either performed or assessed with clinical judgement   ADL Overall ADL's : Needs assistance/impaired Eating/Feeding: Set up;Sitting   Grooming: Minimal assistance;Standing   Upper Body Bathing: Minimal assistance;Sitting   Lower Body Bathing: Moderate assistance;Sit to/from stand   Upper Body Dressing : Moderate assistance;Sitting   Lower Body Dressing: Moderate assistance;Sit to/from stand   Toilet Transfer: Moderate assistance;Ambulation;Comfort height toilet   Toileting- Clothing Manipulation and Hygiene: Minimal assistance;Sit to/from stand       Functional mobility during ADLs: Moderate assistance;+2 for safety/equipment       Vision Baseline Vision/History: Wears glasses Wears Glasses: At all times Patient Visual Report: No change from baseline Vision Assessment?: Vision impaired- to be further tested in functional context;Yes Eye Alignment: Within Functional Limits Ocular Range of Motion: Within Functional Limits Alignment/Gaze Preference: Within Defined Limits Tracking/Visual Pursuits: Decreased smoothness of horizontal tracking;Decreased smoothness of vertical tracking Saccades: Additional head turns occurred during testing Visual Fields: Impaired-to be further tested in functional context Additional Comments: Missing targets in L visual field during visual field testing     Perception Perception Perception Tested?: Yes Perception Deficits: Inattention/neglect Inattention/Neglect: Impaired- to be further tested in functional context Spatial deficits: Poor awareness of orientation in space; overshooting when reaching for objects   Praxis Praxis Praxis tested?: Deficits Praxis-Other Comments: will further assess; difficulty with organization of  tasks, especially with use of LUE    Pertinent Vitals/Pain Pain Assessment:  No/denies pain Faces Pain Scale: (P) No hurt     Hand Dominance (P) Right   Extremity/Trunk Assessment Upper Extremity Assessment Upper Extremity Assessment: LUE deficits/detail LUE Deficits / Details: (P) generalized weakness but attempting to use functionally; poor proprioception and apparent sensorimotor deficits; Pt reaching for items and not realizing she does not have them in her hand LUE Sensation: (P) decreased light touch;decreased proprioception LUE Coordination: (P) decreased fine motor   Lower Extremity Assessment Lower Extremity Assessment: Defer to PT evaluation   Cervical / Trunk Assessment Cervical / Trunk Assessment: (P) Other exceptions Cervical / Trunk Exceptions: (P) L bias   Communication Communication Communication: (P) No difficulties   Cognition Arousal/Alertness: (P) Awake/alert Behavior During Therapy: (P) Impulsive Overall Cognitive Status: (P) Impaired/Different from baseline Area of Impairment: (P) Attention;Following commands;Safety/judgement;Awareness;Problem solving                   Current Attention Level: (P) Sustained   Following Commands: (P) Follows one step commands consistently Safety/Judgement: (P) Decreased awareness of safety;Decreased awareness of deficits Awareness: (P) Emergent Problem Solving: (P) Slow processing     General Comments       Exercises     Shoulder Instructions      Home Living Family/patient expects to be discharged to:: (P) Private residence Living Arrangements: (P) Spouse/significant other Available Help at Discharge: (P) Family;Available 24 hours/day Type of Home: (P) House Home Access: (P) Stairs to enter Entrance Stairs-Number of Steps: (P) 2 Entrance Stairs-Rails: (P) None Home Layout: (P) One level     Bathroom Shower/Tub: (P) Tub/shower unit   Bathroom Toilet: (P) Standard Bathroom Accessibility: (P) Yes How Accessible: Accessible via walker Home Equipment: (P) Grab bars -  tub/shower      Lives With: (P) Spouse    Prior Functioning/Environment Level of Independence: (P) Independent        Comments: (P) Pt driving, states she has many hobbies and likes to golf        OT Problem List: Decreased strength;Decreased activity tolerance;Impaired balance (sitting and/or standing);Impaired vision/perception;Decreased coordination;Decreased cognition;Decreased safety awareness;Decreased knowledge of use of DME or AE;Impaired sensation;Impaired UE functional use      OT Treatment/Interventions: Self-care/ADL training;Therapeutic exercise;Neuromuscular education;DME and/or AE instruction;Therapeutic activities;Cognitive remediation/compensation;Visual/perceptual remediation/compensation;Patient/family education;Balance training    OT Goals(Current goals can be found in the care plan section) Acute Rehab OT Goals Patient Stated Goal: (P) to go home today OT Goal Formulation: With patient/family Time For Goal Achievement: 03/12/17 Potential to Achieve Goals: Good ADL Goals Pt Will Perform Grooming: with modified independence;sitting Pt Will Perform Upper Body Bathing: with modified independence;sitting Pt Will Perform Lower Body Bathing: with supervision;sit to/from stand Pt Will Perform Upper Body Dressing: with modified independence;sitting Pt Will Perform Lower Body Dressing: with supervision;sit to/from stand Pt Will Transfer to Toilet: with supervision;ambulating  OT Frequency: Min 2X/week   Barriers to D/C:            Co-evaluation PT/OT/SLP Co-Evaluation/Treatment: Yes (partial session)     OT goals addressed during session: ADL's and self-care      AM-PAC PT "6 Clicks" Daily Activity     Outcome Measure Help from another person eating meals?: None Help from another person taking care of personal grooming?: A Little Help from another person toileting, which includes using toliet, bedpan, or urinal?: A Lot Help from another person bathing  (including washing, rinsing, drying)?: A Lot Help from another person  to put on and taking off regular upper body clothing?: A Little Help from another person to put on and taking off regular lower body clothing?: A Lot 6 Click Score: 16   End of Session Equipment Utilized During Treatment: Gait belt Nurse Communication: Mobility status  Activity Tolerance: Patient tolerated treatment well Patient left: in chair;with call bell/phone within reach;with chair alarm set;with family/visitor present  OT Visit Diagnosis: Other abnormalities of gait and mobility (R26.89);Muscle weakness (generalized) (M62.81);Low vision, both eyes (H54.2);Other symptoms and signs involving cognitive function                Time: 1453-1526 OT Time Calculation (min): 33 min Charges:  OT General Charges $OT Visit: 1 Visit OT Evaluation $OT Eval Moderate Complexity: 1 Mod G-Codes:     [facility], OT/L  [phone] 02/26/2017  Zoe Andrade,HILLARY 02/26/2017, 3:39 PM

## 2017-02-26 NOTE — Evaluation (Cosign Needed)
Speech Language Pathology Evaluation Patient Details Name: Zoe Andrade MRN: [id number] DOB: 03-23-1941 Today's Date: 02/26/2017 Time: 3570-1779 SLP Time Calculation (min) (ACUTE ONLY): 21 min  Problem List:  Patient Active Problem List   Diagnosis Date Noted  . 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 Cancer ([place])   . Hypertension    Past Surgical History:  Past Surgical History:  Procedure Laterality Date  . MASTECTOMY     HPI:  Pt is an 76 y.o.femalewho presented acutely to the ED via EMS as a Code Stroke with sudden onset of left sided weakness, left facial droop, rightward gaze deviation and slurred speech. Administered tPA upon arrival on 9/12. No prior history of stroke. Head CT shows right posterior M2 occlusion with marked attenuation of distal MCA branch vessels, M1 segments are intact bilaterally, atherosclerotic calcifications at the cavernous internal carotid arteries bilaterally without significant stenoses, tortuosity of the cervical internal carotid arteries bilaterally   Assessment / Plan / Recommendation Clinical Impression  Pt demonstrates receptive and expressive language within functional limits. Speech is intelligible; characterized by mild lingual weakness and mild left facial asymmetry. Pt exhibits impulsivity when completing tasks and responding to questions. She completed visuospatial tasks on the [facility] with a score of 1/5 which could be representative of visual neglect or the pt not focusing on instructions before attempting response. Pt demonstrates lack of awareness of deficits; does not self-correct despite moderate question cues. High concern for impulsivity and safety awareness; discussed with family. Will f/u for further diagnostic treatment and functional communication.     SLP Assessment  SLP Recommendation/Assessment: Patient needs continued Speech Lanaguage Pathology Services     Follow Up Recommendations  Outpatient SLP    Frequency and Duration min 2x/week  2 weeks      SLP Evaluation Cognition  Overall Cognitive Status: Impaired/Different from baseline Arousal/Alertness: Awake/alert Orientation Level: Oriented X4 Attention: Sustained Sustained Attention: Appears intact Memory: Appears intact Awareness: Impaired Awareness Impairment: Emergent impairment Executive Function: Reasoning;Self Monitoring;Self Correcting Reasoning: Impaired Reasoning Impairment: Verbal basic;Functional basic Self Monitoring: Impaired Self Monitoring Impairment: Verbal basic;Functional basic Self Correcting: Impaired Self Correcting Impairment: Verbal basic;Functional basic Behaviors: Impulsive Safety/Judgment: Impaired       Comprehension  Auditory Comprehension Overall Auditory Comprehension: Appears within functional limits for tasks assessed Commands: Within Functional Limits Conversation: Complex    Expression Expression Primary Mode of Expression: Verbal Verbal Expression Overall Verbal Expression: Appears within functional limits for tasks assessed   Oral / Motor  Oral Motor/Sensory Function Overall Oral Motor/Sensory Function: Mild impairment Facial Symmetry: Abnormal symmetry left Lingual ROM: Reduced left Lingual Strength: Reduced Motor Speech Overall Motor Speech: Impaired Respiration: Within functional limits Phonation: Normal Resonance: Within functional limits Articulation: Impaired Level of Impairment: Phrase Intelligibility: Intelligible (mild lingual weakness) Motor Speech Errors: Unaware   GO                    Aaron Edelman, Student SLP 02/26/2017, 10:02 AM

## 2017-02-26 NOTE — Evaluation (Signed)
Physical Therapy Evaluation Patient Details Name: Zoe Andrade MRN: [id number] DOB: [birth year] Today's Date: 02/26/2017   History of Present Illness  76 y.o. female who presented acutely to the ED via EMS as a Code Stroke with sudden onset of left sided weakness, left facial droop, rightward gaze deviation and slurred speech. baseline NIHSS of 17. tPA given. new baseline of NIHSS 3. MRI + No improvement in the posterior right MCA M2 branch occlusion and other MCA branches; R posterior MCA  infarct, extending posteriorly   Clinical Impression  Pt admitted with above diagnosis. Pt currently with functional limitations due to the deficits listed below (see PT Problem List). At the time of PT eval pt was able to perform transfers and ambulation with +2 assist mainly for balance support and safety. Feel a RW would be appropriate next session. Feel continued rehab at the CIR level would be beneficial in maximizing return to PLOF. Acutely, pt will benefit from skilled PT to increase their independence and safety with mobility to allow discharge to the venue listed below.       Follow Up Recommendations CIR;Supervision/Assistance - 24 hour    Equipment Recommendations  Rolling walker with 5" wheels;3in1 (PT)    Recommendations for Other Services Rehab consult     Precautions / Restrictions Precautions Precautions: Fall Precaution Comments: poor awareness Restrictions Weight Bearing Restrictions: No      Mobility  Bed Mobility Overal bed mobility: Needs Assistance Bed Mobility: Supine to Sit     Supine to sit: Min assist        Transfers Overall transfer level: Needs assistance Equipment used: 2 person hand held assist Transfers: Sit to/from [facility] Sit to Stand: Min assist Stand pivot transfers: Mod assist       General transfer comment: L bias  Ambulation/Gait Ambulation/Gait assistance: Min assist;+2 safety/equipment Ambulation Distance (Feet):  15 Feet Assistive device: 2 person hand held assist Gait Pattern/deviations: Step-through pattern;Decreased stride length;Trunk flexed Gait velocity: Decreased Gait velocity interpretation: Below normal speed for age/gender General Gait Details: Pt consistently leaning and drifting L with ambulation. Reaching out for environmental support with the LUE.   Stairs            Wheelchair Mobility    Modified Rankin (Stroke Patients Only) Modified Rankin (Stroke Patients Only) Pre-Morbid Rankin Score: No symptoms Modified Rankin: Moderately severe disability     Balance Overall balance assessment: Needs assistance   Sitting balance-Leahy Scale: Fair   Postural control: Posterior lean;Left lateral lean (when distracted)   Standing balance-Leahy Scale: Poor Standing balance comment: L bias                             Pertinent Vitals/Pain Pain Assessment: No/denies pain Faces Pain Scale: No hurt    Home Living Family/patient expects to be discharged to:: Private residence Living Arrangements: Spouse/significant other Available Help at Discharge: Family;Available 24 hours/day Type of Home: House Home Access: Stairs to enter Entrance Stairs-Rails: None Entrance Stairs-Number of Steps: 2 Home Layout: One level Home Equipment: Grab bars - tub/shower      Prior Function Level of Independence: Independent         Comments: Pt driving, states she has many hobbies and likes to golf     Hand Dominance   Dominant Hand: Right    Extremity/Trunk Assessment   Upper Extremity Assessment Upper Extremity Assessment: Defer to OT evaluation LUE Deficits / Details: generalized weakness but attempting  to use functionally; poor proprioception and apparent sensorimotor deficits; Pt reaching for items and not realizing she does not have them in her hand LUE Sensation: decreased light touch;decreased proprioception LUE Coordination: decreased fine motor    Lower  Extremity Assessment Lower Extremity Assessment: LLE deficits/detail LLE Deficits / Details: Difficult to fully assess as pt had difficulty with MMT instruction. Feel there is some L sided weakness present however pt not putting forth full effort on the R side, so difficult to get a true sense of weakness on the L compared to baseline.     Cervical / Trunk Assessment Cervical / Trunk Assessment: Other exceptions Cervical / Trunk Exceptions: L bias  Communication   Communication: No difficulties  Cognition Arousal/Alertness: Awake/alert Behavior During Therapy: Impulsive Overall Cognitive Status: Impaired/Different from baseline Area of Impairment: Attention;Following commands;Safety/judgement;Awareness;Problem solving                   Current Attention Level: Sustained   Following Commands: Follows one step commands consistently Safety/Judgement: Decreased awareness of safety;Decreased awareness of deficits Awareness: Emergent Problem Solving: Slow processing General Comments: Pt alert and oriented x4      General Comments      Exercises     Assessment/Plan    PT Assessment Patient needs continued PT services  PT Problem List Decreased strength;Decreased range of motion;Decreased activity tolerance;Decreased balance;Decreased mobility;Decreased coordination;Decreased cognition;Decreased knowledge of use of DME;Decreased safety awareness;Decreased knowledge of precautions;Impaired sensation       PT Treatment Interventions DME instruction;Gait training;Stair training;Functional mobility training;Therapeutic activities;Therapeutic exercise;Neuromuscular re-education;Patient/family education;Cognitive remediation    PT Goals (Current goals can be found in the Care Plan section)  Acute Rehab PT Goals Patient Stated Goal: to go home today PT Goal Formulation: With patient/family Time For Goal Achievement: 03/12/17 Potential to Achieve Goals: Good    Frequency Min  4X/week   Barriers to discharge        Co-evaluation PT/OT/SLP Co-Evaluation/Treatment: Yes Reason for Co-Treatment: To address functional/ADL transfers;For patient/therapist safety;Necessary to address cognition/behavior during functional activity PT goals addressed during session: Mobility/safety with mobility;Balance OT goals addressed during session: ADL's and self-care       AM-PAC PT "6 Clicks" Daily Activity  Outcome Measure Difficulty turning over in bed (including adjusting bedclothes, sheets and blankets)?: A Little Difficulty moving from lying on back to sitting on the side of the bed? : A Little Difficulty sitting down on and standing up from a chair with arms (e.g., wheelchair, bedside commode, etc,.)?: A Little Help needed moving to and from a bed to chair (including a wheelchair)?: A Little Help needed walking in hospital room?: A Little Help needed climbing 3-5 steps with a railing? : A Lot 6 Click Score: 17    End of Session Equipment Utilized During Treatment: Gait belt Activity Tolerance: Patient tolerated treatment well Patient left: in chair;with call bell/phone within reach;with chair alarm set;with nursing/sitter in room;with family/visitor present (OT present) Nurse Communication: Mobility status PT Visit Diagnosis: Unsteadiness on feet (R26.81);Hemiplegia and hemiparesis Hemiplegia - Right/Left: Left Hemiplegia - dominant/non-dominant: Non-dominant Hemiplegia - caused by: Cerebral infarction    Time: 4696-2952 PT Time Calculation (min) (ACUTE ONLY): 21 min   Charges:   PT Evaluation $PT Eval Moderate Complexity: 1 Mod     PT G Codes:        Rolinda Roan, PT, DPT Acute Rehabilitation Services Pager: [phone]   Thelma Comp 02/26/2017, 3:46 PM

## 2017-02-26 NOTE — Progress Notes (Signed)
STROKE TEAM PROGRESS NOTE   HISTORY OF PRESENT ILLNESS (per record) Zoe Andrade is an 76 y.o. female who presented acutely to the ED via EMS as a Code Stroke with sudden onset of left sided weakness, left facial droop, rightward gaze deviation and slurred speech. She woke up this AM in her USOH. Later in the morning while sitting down at a restaurant with her husband she became unresponsive and then, when she started to speak again, the above symptoms were noted. Symptoms began at 0900. She has no prior history of stroke. She is not on an antiplatelet agent or a blood thinner. Her only home medications are Plendil and Tenormin.    LSN: [id number] tPA Given: Yes, administered IV t-PA at 02/25/2017 at [place] Medications:  Tenormin  Plendil  ROS: No headache or chest pain. Other ROS as per HPI. Detailed additional ROS deferred due to acuity of presentation.   Physical Examination: There were no vitals taken for this visit.  HEENT: [place]/AT Lungs: Respirations unlabored Ext: No edema  Patient was administered IV t-PA at 02/25/2017 at 1015.  She was admitted to the neuro ICU for further evaluation and treatment.   SUBJECTIVE (INTERVAL HISTORY) Her husband and two daughters are at the bedside.  The patient is awake, alert, and oriented.  She follow all commands appropriately.  The pt states that the weakness in her extremities quickly resolved after tPA administration.  She has a residual L facial droop, very mild slurring of speech, and some fine motor incoordination/apraxia in her left hand. MRI pending   OBJECTIVE Temp:  [97.6 F (36.4 C)-98.5 F (36.9 C)] 98.5 F (36.9 C) (09/13 1557) Pulse Rate:  [66-90] 72 (09/13 1100) Cardiac Rhythm: Normal sinus rhythm (09/13 1000) Resp:  [14-22] 19 (09/13 1400) BP: (118-157)/(71-110) 134/110 (09/13 1400) SpO2:  [91 %-99 %] 97 % (09/13 1100)  CBC:   Recent Labs Lab 02/25/17 0946 02/25/17 0951 02/26/17 0653  WBC 12.5*  --  10.7*   NEUTROABS 6.6  --   --   HGB 14.1 14.6 14.2  HCT 42.1 43.0 41.5  MCV 89.4  --  89.1  PLT 185  --  174    Basic Metabolic Panel:   Recent Labs Lab 02/25/17 0946 02/25/17 0951 02/26/17 0653  NA 138 141 135  K 3.4* 3.3* 3.5  CL 108 106 105  CO2 20*  --  20*  GLUCOSE 134* 135* 105*  BUN 12 13 6   CREATININE 0.80 0.70 0.68  CALCIUM 9.3  --  9.1    Lipid Panel:     Component Value Date/Time   CHOL 186 02/26/2017 0208   TRIG 101 02/26/2017 0208   HDL 51 02/26/2017 0208   CHOLHDL 3.6 02/26/2017 0208   VLDL 20 02/26/2017 0208   LDLCALC 115 (H) 02/26/2017 0208   HgbA1c:  Lab Results  Component Value Date   HGBA1C 5.3 02/26/2017   Urine Drug Screen: No results found for: LABOPIA, COCAINSCRNUR, LABBENZ, AMPHETMU, THCU, LABBARB  Alcohol Level No results found for: [place] I have personally reviewed the radiological images below and agree with the radiology interpretations.  Ct Angio Head and neck W Or Wo Contrast 02/25/2017 IMPRESSION: 1. Right posterior M2 occlusion with marked attenuation of distal MCA branch vessels. 2. M1 segments are intact bilaterally. 3. Atherosclerotic calcifications at the cavernous internal carotid arteries bilaterally without significant stenoses. 4. Tortuosity of the cervical internal carotid arteries bilaterally without significant stenosis. 5. Mild degenerative changes of  the cervical spine.   Mri and Mra Head Wo Contrast 02/26/2017 IMPRESSION: 1. No improvement in the posterior right MCA M2 branch occlusion and attenuation of other right MCA branches since the CTA yesterday. 2. However, a relatively small posterior right MCA territory core infarct is demonstrated, extending posteriorly in a patchy fashion from the posterior half of the insula. No associated hemorrhage or mass effect. 3. Only mild for age nonspecific white matter signal changes otherwise.   Ct Head Code Stroke Wo Contrast 02/25/2017 IMPRESSION: Possible dense/thrombosed  right MCA. ASPECTS is 10. No acute hemorrhage.    TTE  - Left ventricle: The cavity size was normal. Systolic function was   normal. The estimated ejection fraction was in the range of 60%   to 65%. Wall motion was normal; there were no regional wall   motion abnormalities. Doppler parameters are consistent with   abnormal left ventricular relaxation (grade 1 diastolic   dysfunction). - Left atrium: The atrium was mildly dilated. Anterior-posterior   dimension: 41 mm. Impressions: - No cardiac source of emboli was indentified.  LE venous doppler pending  TEE pending   PHYSICAL EXAM  Temp:  [97.6 F (36.4 C)-98.5 F (36.9 C)] 98.5 F (36.9 C) (09/13 1557) Pulse Rate:  [66-90] 72 (09/13 1100) Resp:  [14-22] 19 (09/13 1400) BP: (118-157)/(71-110) 134/110 (09/13 1400) SpO2:  [91 %-99 %] 97 % (09/13 1100)  General - Well nourished, well developed, in no apparent distress.  Ophthalmologic - Sharp disc margins OU.   Cardiovascular - Regular rate and rhythm.  Mental Status -  Level of arousal and orientation to time, place, and person were intact. Language including expression, naming, repetition, comprehension was assessed and found intact. B/l hand praxia. Fund of Knowledge was assessed and was intact.  Cranial Nerves II - XII - II - left simultagnosia. III, IV, VI - Extraocular movements intact. V - Facial sensation intact bilaterally. VII - right nasolabial fold flattening. VIII - Hearing & vestibular intact bilaterally. X - Palate elevates symmetrically. XI - Chin turning & shoulder shrug intact bilaterally. XII - Tongue protrusion intact.  Motor Strength - The patient's strength was normal in all extremities except left hand dexterity difficulty with mild pronator drift.  Bulk was normal and fasciculations were absent.   Motor Tone - Muscle tone was assessed at the neck and appendages and was normal.  Reflexes - The patient's reflexes were 1+ in all extremities and  she had no pathological reflexes.  Sensory - Light touch, temperature/pinprick were assessed and were symmetrical. However, with simultaneous stimulation bilaterally, pt neglect left side.  Coordination - The patient had normal movements in the hands with no ataxia or dysmetria.  Tremor was absent.  Gait and Station - deferred   ASSESSMENT/PLAN Ms. Rox Mcgriff is a 76 y.o. female with history of HTN presenting with left sided weakness and right gaze, slurry speech. She received IV t-PA and symptoms much improved.   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 Yes; Fluid consistency: Thin Diet NPO time specified  No antithrombotic prior to admission, now on aspirin 325 mg daily  Patient counseled to be compliant with her antithrombotic medications  Ongoing aggressive stroke risk factor management  Therapy recommendations:  pending  Disposition:  pending  Hypertension  Stable  Permissive hypertension (OK if < 180/105) but gradually normalize in 5-7 days  Long-term BP goal normotensive  Hyperlipidemia  Home meds:  none  LDL 115, goal < 70  Add lipitor 20  Continue statin at discharge  Other Stroke Risk Factors  Advanced age  Other [facility] day # 1  This patient is critically ill due to right MCA stroke, hypertension, status post TPA and at significant risk of neurological worsening, death form recurrent stroke, hemorrhagic conversion, bleeding. This patient's care requires constant monitoring of vital signs, hemodynamics,  respiratory and cardiac monitoring, review of multiple databases, neurological assessment, discussion with family, other specialists and medical decision making of high complexity. I spent 40 minutes of neurocritical care time in the care of this patient. I had long discussion with husband and daughters at bedside, updated pt current condition, treatment plan and potential prognosis. They expressed understanding and appreciation.  Rosalin Hawking, MD PhD Stroke Neurology 02/26/2017 4:37 PM  To contact Stroke Continuity provider, please refer to [email or web address] After hours, contact General Neurology

## 2017-02-26 NOTE — Progress Notes (Signed)
Pt was in bed attempting to drink a cup of coffee. Pt stated that the bedside table was too close to the bed and caused the coffee to spill into bed. Pt likely got out of bed once coffee was spilled, being that it was very hot. Pt did not hit head, landed on bottom. Area assessed no bruising or redness. Pt does not complain of pain. VS within normal limits. MD notified, no new orders given. Attempted to call pt family unsuccessfully. Will continue to monitor. Fortino Sic, RN, BSN 02/26/2017 7:07 AM

## 2017-02-27 ENCOUNTER — Encounter (HOSPITAL_COMMUNITY): Payer: Medicare HMO

## 2017-02-27 DIAGNOSIS — I10 Essential (primary) hypertension: Secondary | ICD-10-CM

## 2017-02-27 DIAGNOSIS — I519 Heart disease, unspecified: Secondary | ICD-10-CM

## 2017-02-27 DIAGNOSIS — I63411 Cerebral infarction due to embolism of right middle cerebral artery: Principal | ICD-10-CM

## 2017-02-27 DIAGNOSIS — I63311 Cerebral infarction due to thrombosis of right middle cerebral artery: Secondary | ICD-10-CM

## 2017-02-27 DIAGNOSIS — I5189 Other ill-defined heart diseases: Secondary | ICD-10-CM

## 2017-02-27 LAB — CBC
HCT: 40 % (ref 36.0–46.0)
HEMOGLOBIN: 13.4 g/dL (ref 12.0–15.0)
MCH: 30.2 pg (ref 26.0–34.0)
MCHC: 33.5 g/dL (ref 30.0–36.0)
MCV: 90.3 fL (ref 78.0–100.0)
Platelets: 170 10*3/uL (ref 150–400)
RBC: 4.43 MIL/uL (ref 3.87–5.11)
RDW: 13.5 % (ref 11.5–15.5)
WBC: 9.2 10*3/uL (ref 4.0–10.5)

## 2017-02-27 LAB — BASIC METABOLIC PANEL
ANION GAP: 8 (ref 5–15)
BUN: 9 mg/dL (ref 6–20)
CHLORIDE: 108 mmol/L (ref 101–111)
CO2: 22 mmol/L (ref 22–32)
Calcium: 9 mg/dL (ref 8.9–10.3)
Creatinine, Ser: 0.73 mg/dL (ref 0.44–1.00)
GFR calc non Af Amer: >60 mL/min (ref >60)
Glucose, Bld: 96 mg/dL (ref 65–99)
POTASSIUM: 3.5 mmol/L (ref 3.5–5.1)
Sodium: 138 mmol/L (ref 135–145)

## 2017-02-27 LAB — GLUCOSE, CAPILLARY
GLUCOSE-CAPILLARY: 95 mg/dL (ref 65–99)
GLUCOSE-CAPILLARY: 98 mg/dL (ref 65–99)

## 2017-02-27 NOTE — Progress Notes (Addendum)
Inpatient Rehabilitation  Reviewed most recent PT note that shows progress with a continued significant fall risk.  Met with patient, spouse, and daughter at bedside to discuss team's recommendation for IP Rehab.  Shared booklets and answered questions.  They are also concerned with her fall risk and would like to get IP Rehab in order for patient to maximize her functional independence prior to returning home with spouse, who is unable to physically help her.  Will proceed with initiating insurance authorization.  Will follow up with the team Monday, 03/02/17 regarding medical readiness, insurance authorization, and IP Rehab bed availability.  Please call with questions.   Carmelia Roller., CCC/SLP Admission Coordinator  [place]  Cell [phone]

## 2017-02-27 NOTE — Progress Notes (Signed)
Patient had an assisted fall was assisted to bathroom by rn and as she got up the gown got stack under the walker thus making her to lean to the left side which was weak and the RN assisted her to knee down on the floor. No apparent injury noted . MD on call made aware and pt daughter Olin Hauser also notified. All fall precautions measures were in place  VS stable.

## 2017-02-27 NOTE — Progress Notes (Addendum)
Inpatient Rehabilitation  Per Dr. Serita Grit consult I await updated PT notes from today in order to determine if therapy needs continue; however, note that patient had an assisted fall during self-care today with nursing.  Plan to initiate insurance authorization following PT visit today if appropriate.  Please call with questions.   Carmelia Roller., CCC/SLP Admission Coordinator  [place]  Cell [phone]

## 2017-02-27 NOTE — Progress Notes (Signed)
    [facility] has been requested to perform a transesophageal echocardiogram on [name] for Stroke.  After careful review of history and examination, the risks and benefits of transesophageal echocardiogram have been explained including risks of esophageal damage, perforation (1:10,000 risk), bleeding, pharyngeal hematoma as well as other potential complications associated with conscious sedation including aspiration, arrhythmia, respiratory failure and death. Alternatives to treatment were discussed, questions were answered. Patient is willing to proceed.  TEE - Dr. Meda Coffee  @ 1500 . NPO after midnight. Meds with sips.   Emmalyn Hinson, PA-C 02/27/2017 3:59 PM

## 2017-02-27 NOTE — Progress Notes (Signed)
Physical Therapy Treatment Patient Details Name: Zoe Andrade MRN: [id number] DOB: 12-Jun-1941 Today's Date: 02/27/2017    History of Present Illness 76 y.o. female who presented acutely to the ED via EMS as a Code Stroke with sudden onset of left sided weakness, left facial droop, rightward gaze deviation and slurred speech. baseline NIHSS of 17. tPA given. new baseline of NIHSS 3. MRI + No improvement in the posterior right MCA M2 branch occlusion and other MCA branches; R posterior MCA  infarct, extending posteriorly     PT Comments    Pt demo good progress with return of strength LUE/LE. Pt continues to demo slight facial droop L. Proprioceptive deficits noted LLE as well as difficulty finding/maintaining midline in sitting and standing.  Impulsivity, decreased awareness of deficits and poor safety awareness are her greatest barriers for return home. This combined with her mobility deficits puts her at great risk for falls. She has had 2 falls in the hospital, one yesterday and one this AM. She is currently needing a RW for ambulation. In her home environment, the RW could result in a greater fall risk as the pt did not use AD at her baseline. Pt is a good rehab candidate for further progress toward independent level to ensure a safe transition home.    Follow Up Recommendations  CIR;Supervision/Assistance - 24 hour     Equipment Recommendations  Rolling walker with 5" wheels;3in1 (PT)    Recommendations for Other Services Rehab consult     Precautions / Restrictions Precautions Precautions: Fall Precaution Comments: poor awareness    Mobility  Bed Mobility         Supine to sit: Min guard     General bed mobility comments: min guard for safety  Transfers   Equipment used: Rolling walker (2 wheeled) Transfers: Sit to/from [facility] Sit to Stand: Min assist Stand pivot transfers: Min assist       General transfer comment: increased time to  stabilize initial standing balance, verbal cues for sequencing and safety  Ambulation/Gait Ambulation/Gait assistance: Min assist Ambulation Distance (Feet): 25 Feet Assistive device: Rolling walker (2 wheeled) Gait Pattern/deviations: Step-through pattern;Decreased stride length;Decreased weight shift to left Gait velocity: Decreased Gait velocity interpretation: Below normal speed for age/gender General Gait Details: assist with maneuvering through turns and obstacles. Poor safety awareness, lifting all 4 legs of RW off floor for turning. Decreased proprioception LLE.   Stairs            Wheelchair Mobility    Modified Rankin (Stroke Patients Only) Modified Rankin (Stroke Patients Only) Pre-Morbid Rankin Score: No symptoms Modified Rankin: Moderately severe disability     Balance     Sitting balance-Leahy Scale: Fair   Postural control: Posterior lean   Standing balance-Leahy Scale: Poor Standing balance comment: midline shift toward right in stance and posterio lean in sitting                            Cognition Arousal/Alertness: Awake/alert Behavior During Therapy: Impulsive Overall Cognitive Status: Impaired/Different from baseline Area of Impairment: Attention;Following commands;Safety/judgement;Awareness;Problem solving                   Current Attention Level: Sustained   Following Commands: Follows one step commands consistently;Follows multi-step commands inconsistently;Follows multi-step commands with increased time Safety/Judgement: Decreased awareness of safety;Decreased awareness of deficits Awareness: Emergent Problem Solving: Slow processing;Difficulty sequencing General Comments: Pt alert and oriented x4  Exercises General Exercises - Lower Extremity Mini-Sqauts: 5 reps;Standing;AAROM (assist to control ascent/descent and prevent L knee buckling)    General Comments        Pertinent Vitals/Pain Pain Assessment:  No/denies pain    Home Living                      Prior Function            PT Goals (current goals can now be found in the care plan section) Acute Rehab PT Goals Patient Stated Goal: home PT Goal Formulation: With patient/family Time For Goal Achievement: 03/12/17 Potential to Achieve Goals: Good Progress towards PT goals: Progressing toward goals    Frequency    Min 4X/week      PT Plan Current plan remains appropriate    Co-evaluation              AM-PAC PT "6 Clicks" Daily Activity  Outcome Measure  Difficulty turning over in bed (including adjusting bedclothes, sheets and blankets)?: None Difficulty moving from lying on back to sitting on the side of the bed? : A Little Difficulty sitting down on and standing up from a chair with arms (e.g., wheelchair, bedside commode, etc,.)?: A Little Help needed moving to and from a bed to chair (including a wheelchair)?: A Little Help needed walking in hospital room?: A Little Help needed climbing 3-5 steps with a railing? : A Lot 6 Click Score: 18    End of Session Equipment Utilized During Treatment: Gait belt Activity Tolerance: Patient tolerated treatment well Patient left: in chair;with call bell/phone within reach;with family/visitor present Nurse Communication: Mobility status PT Visit Diagnosis: Unsteadiness on feet (R26.81);Hemiplegia and hemiparesis Hemiplegia - Right/Left: Left Hemiplegia - dominant/non-dominant: Non-dominant Hemiplegia - caused by: Cerebral infarction     Time: 5027-7412 PT Time Calculation (min) (ACUTE ONLY): 49 min  Charges:  $Gait Training: 8-22 mins $Therapeutic Activity: 23-37 mins                    G Codes:       Lorrin Goodell, PT  Office # [phone] Pager [phone]    Lorriane Shire 02/27/2017, 11:46 AM

## 2017-02-27 NOTE — Consult Note (Signed)
Physical Medicine and Rehabilitation Consult Reason for Consult: Left side weakness with slurred speech Referring Physician: Dr.Xu   HPI: Zoe Andrade is a 76 y.o. right handed female with history of hypertension. Per chart review and patient, patient lives with spouse. Independent prior to admission. One level home with 2 steps to entry. Presented 02/25/2017 with left-sided weakness and facial droop of acute onset. Cranial CT scan showed possible dense thrombosed right MCA. No acute hemorrhage. Patient did receive TPA. CT Angio head and neck showed right posterior M2 occlusion with marked attenuation of distal MCA branch vessels. Echocardiogram with ejection fraction of 51% grade 1 diastolic dysfunction. No source of emboli. MRI reviewed, showing right MCA infarction. No associated hemorrhage or mass effect. Neurology consulted currently on aspirin for CVA prophylaxis. Subcutaneous Lovenox for DVT prophylaxis. Await venous Doppler studies lower extremities as well as TEE.   Review of Systems  Constitutional: Negative for chills and fever.  Eyes: Negative for blurred vision and double vision.  Respiratory: Negative for shortness of breath.   Cardiovascular: Negative for chest pain, palpitations and leg swelling.  Gastrointestinal: Positive for constipation. Negative for nausea and vomiting.  Genitourinary: Negative for dysuria and flank pain.  Musculoskeletal: Positive for myalgias.  Skin: Negative for rash.  Neurological: Positive for speech change. Negative for seizures.       +FMC difficulty  All other systems reviewed and are negative.  Past Medical History:  Diagnosis Date  . Cancer ([place])   . Hypertension    Past Surgical History:  Procedure Laterality Date  . MASTECTOMY Right    No pertinent family history of premature CVA. Social History:  reports that she has never smoked. She has never used smokeless tobacco. She reports that she does not drink alcohol or use  drugs. Allergies: No Known Allergies Medications Prior to Admission  Medication Sig Dispense Refill  . atenolol (TENORMIN) 50 MG tablet Take 50 mg by mouth daily.    . felodipine (PLENDIL) 10 MG 24 hr tablet Take 10 mg by mouth daily.      Home: Home Living Family/patient expects to be discharged to:: Private residence Living Arrangements: Spouse/significant other Available Help at Discharge: Family, Available 24 hours/day Type of Home: House Home Access: Stairs to enter [employer] of Steps: 2 Entrance Stairs-Rails: None Home Layout: One level Bathroom Shower/Tub: Chiropodist: Standard Bathroom Accessibility: Yes Home Equipment: Grab bars - tub/shower  Lives With: Spouse  Functional History: Prior Function Level of Independence: Independent Comments: Pt driving, states she has many hobbies and likes to golf Functional Status:  Mobility: Bed Mobility Overal bed mobility: Needs Assistance Bed Mobility: Supine to Sit Supine to sit: Min assist Transfers Overall transfer level: Needs assistance Equipment used: 2 person hand held assist Transfers: Sit to/from Stand, Stand Pivot Transfers Sit to Stand: Min assist Stand pivot transfers: Mod assist General transfer comment: L bias Ambulation/Gait Ambulation/Gait assistance: Min assist, +2 safety/equipment Ambulation Distance (Feet): 15 Feet Assistive device: 2 person hand held assist Gait Pattern/deviations: Step-through pattern, Decreased stride length, Trunk flexed General Gait Details: Pt consistently leaning and drifting L with ambulation. Reaching out for environmental support with the LUE.  Gait velocity: Decreased Gait velocity interpretation: Below normal speed for age/gender    ADL: ADL Overall ADL's : Needs assistance/impaired Eating/Feeding: Set up, Sitting Grooming: Minimal assistance, Standing Upper Body Bathing: Minimal assistance, Sitting Lower Body Bathing: Moderate  assistance, Sit to/from stand Upper Body Dressing : Moderate assistance, Sitting Lower  Body Dressing: Moderate assistance, Sit to/from stand Toilet Transfer: Moderate assistance, Ambulation, Comfort height toilet Toileting- Clothing Manipulation and Hygiene: Minimal assistance, Sit to/from stand Functional mobility during ADLs: Moderate assistance, +2 for safety/equipment  Cognition: Cognition Overall Cognitive Status: Impaired/Different from baseline Arousal/Alertness: Awake/alert Orientation Level: Oriented X4 Attention: Sustained Sustained Attention: Appears intact Memory: Appears intact Awareness: Impaired Awareness Impairment: Emergent impairment Executive Function: Reasoning, Self Monitoring, Self Correcting Reasoning: Impaired Reasoning Impairment: Verbal basic, Functional basic Self Monitoring: Impaired Self Monitoring Impairment: Verbal basic, Functional basic Self Correcting: Impaired Self Correcting Impairment: Verbal basic, Functional basic Behaviors: Impulsive Safety/Judgment: Impaired Cognition Arousal/Alertness: Awake/alert Behavior During Therapy: Impulsive Overall Cognitive Status: Impaired/Different from baseline Area of Impairment: Attention, Following commands, Safety/judgement, Awareness, Problem solving Current Attention Level: Sustained Following Commands: Follows one step commands consistently Safety/Judgement: Decreased awareness of safety, Decreased awareness of deficits Awareness: Emergent Problem Solving: Slow processing General Comments: Pt alert and oriented x4  Blood pressure 125/74, pulse 63, temperature 98.4 F (36.9 C), temperature source Oral, resp. rate 20, height 5\' 9"  (1.753 m), weight 74.5 kg (164 lb 4.8 oz), SpO2 98 %. Physical Exam  Vitals reviewed. Constitutional: She is oriented to person, place, and time. She appears well-developed and well-nourished.  HENT:  Head: Normocephalic and atraumatic.  Eyes: EOM are normal. Right eye  exhibits no discharge. Left eye exhibits no discharge.  Pupils reactive to light  Neck: Normal range of motion. Neck supple. No thyromegaly present.  Cardiovascular: Normal rate and regular rhythm.   Respiratory: Effort normal and breath sounds normal. No respiratory distress.  GI: Soft. Bowel sounds are normal. She exhibits no distension.  Musculoskeletal: She exhibits no edema or tenderness.  Neurological: She is alert and oriented to person, place, and time.  She does follow simple commands.  Mild dysarthric speech.  Left facial weakness Motor: RUE/RLE: 5/5 proximal to distal.  LUE/LLE: 5-/5 proximal to distal. Mild ataxia, but able to correct. Sensation intact to light touch  Skin: Skin is warm and dry.  Psychiatric: She has a normal mood and affect. Her behavior is normal.    Results for orders placed or performed during the hospital encounter of 02/25/17 (from the past 24 hour(s))  CBC     Status: Abnormal   Collection Time: 02/26/17  6:53 AM  Result Value Ref Range   WBC 10.7 (H) 4.0 - 10.5 K/uL   RBC 4.66 3.87 - 5.11 MIL/uL   Hemoglobin 14.2 12.0 - 15.0 g/dL   HCT 41.5 36.0 - 46.0 %   MCV 89.1 78.0 - 100.0 fL   MCH 30.5 26.0 - 34.0 pg   MCHC 34.2 30.0 - 36.0 g/dL   RDW 13.2 11.5 - 15.5 %   Platelets 173 150 - 400 K/uL  Basic metabolic panel     Status: Abnormal   Collection Time: 02/26/17  6:53 AM  Result Value Ref Range   Sodium 135 135 - 145 mmol/L   Potassium 3.5 3.5 - 5.1 mmol/L   Chloride 105 101 - 111 mmol/L   CO2 20 (L) 22 - 32 mmol/L   Glucose, Bld 105 (H) 65 - 99 mg/dL   BUN 6 6 - 20 mg/dL   Creatinine, Ser 0.68 0.44 - 1.00 mg/dL   Calcium 9.1 8.9 - 10.3 mg/dL   GFR calc non Af Amer >60 >60 mL/min   GFR calc Af Amer >60 >60 mL/min   Anion gap 10 5 - 15  Glucose, capillary     Status: None   Collection  Time: 02/27/17  2:00 AM  Result Value Ref Range   Glucose-Capillary 95 65 - 99 mg/dL   Comment 1 Notify RN    Comment 2 Document in Chart    Ct Angio  Head W Or Wo Contrast  Result Date: 02/25/2017 CLINICAL DATA:  Focal neuro deficit, less than 6 hours, stroke suspected. Acute onset of left-sided weakness beginning at 9 a.m. today. Abnormal CT of the head without contrast. EXAM: CT ANGIOGRAPHY HEAD AND NECK TECHNIQUE: Multidetector CT imaging of the head and neck was performed using the standard protocol during bolus administration of intravenous contrast. Multiplanar CT image reconstructions and MIPs were obtained to evaluate the vascular anatomy. Carotid stenosis measurements (when applicable) are obtained utilizing NASCET criteria, using the distal internal carotid diameter as the denominator. CONTRAST:  50 mL Isovue 370 COMPARISON:  CT head without contrast 02/26/2015. FINDINGS: CTA NECK FINDINGS Aortic arch: A 3 vessel arch configuration is present. Atherosclerotic calcifications are distal to the subclavian artery without significant stenosis or aneurysm. Right carotid system: The right common carotid artery is within normal limits. The right carotid bifurcation is unremarkable. There is tortuosity of the distal cervical right ICA without significant stenosis. Left carotid system: The left common carotid artery is within normal limits. Bifurcation is unremarkable. Mild tortuosity is present in the mid cervical left internal carotid artery without significant stenosis. Vertebral arteries: Vertebral artery is originate from the subclavian arteries bilaterally without significant stenosis. Moderate tortuosity is present proximally without significant stenosis. The vertebral arteries are codominant. There is no significant stenosis of either vertebral artery in the neck. Skeleton: Degenerative endplate changes are most prominent at C5-6 on the right at C3-4 on the left. Vertebral body heights and alignment are maintained. No focal lytic or blastic lesions are present. The patient is edentulous. Other neck: No significant mucosal or submucosal lesions are  present. Salivary glands are within normal limits. There is no significant adenopathy. Upper chest: Mild dependent atelectasis is present at the lung apices bilaterally. No focal nodule or mass lesion is present. There is no significant airspace disease. The superior mediastinum is otherwise unremarkable. Review of the MIP images confirms the above findings CTA HEAD FINDINGS Anterior circulation: Atherosclerotic calcifications are presents within main cavernous internal carotid arteries bilaterally without significant stenosis. The terminal ICA is normal bilaterally. The A1 and M1 segments are intact. A proximal posterior right M2 occlusion is present. This limits opacification of posterior MCA branch vessels. Anterior MCA branch vessels are intact. ACA branch vessels are normal. Left MCA branch vessels are normal. Posterior circulation: The vertebral arteries are codominant. Atherosclerotic changes are present at the dural margins. The vertebrobasilar junction is normal. The basilar artery is normal. Both posterior cerebral arteries originate from basilar tip. PCA branch vessels are within normal limits. Venous sinuses: The dural sinuses are patent. The straight sinus deep cerebral veins are intact. The right transverse sinus is dominant. Anatomic variants: None Delayed phase: Not perform Review of the MIP images confirms the above findings IMPRESSION: 1. Right posterior M2 occlusion with marked attenuation of distal MCA branch vessels. 2. M1 segments are intact bilaterally. 3. Atherosclerotic calcifications at the cavernous internal carotid arteries bilaterally without significant stenoses. 4. Tortuosity of the cervical internal carotid arteries bilaterally without significant stenosis. 5. Mild degenerative changes of the cervical spine. Dr. Damita Dunnings discussed the findings of this study directly with Dr. Cheral Marker at the time of the exam. Electronically Signed   By: San Morelle M.D.   On: 02/25/2017  10:56   Ct Angio Neck W Or Wo Contrast  Result Date: 02/25/2017 CLINICAL DATA:  Focal neuro deficit, less than 6 hours, stroke suspected. Acute onset of left-sided weakness beginning at 9 a.m. today. Abnormal CT of the head without contrast. EXAM: CT ANGIOGRAPHY HEAD AND NECK TECHNIQUE: Multidetector CT imaging of the head and neck was performed using the standard protocol during bolus administration of intravenous contrast. Multiplanar CT image reconstructions and MIPs were obtained to evaluate the vascular anatomy. Carotid stenosis measurements (when applicable) are obtained utilizing NASCET criteria, using the distal internal carotid diameter as the denominator. CONTRAST:  50 mL Isovue 370 COMPARISON:  CT head without contrast 02/26/2015. FINDINGS: CTA NECK FINDINGS Aortic arch: A 3 vessel arch configuration is present. Atherosclerotic calcifications are distal to the subclavian artery without significant stenosis or aneurysm. Right carotid system: The right common carotid artery is within normal limits. The right carotid bifurcation is unremarkable. There is tortuosity of the distal cervical right ICA without significant stenosis. Left carotid system: The left common carotid artery is within normal limits. Bifurcation is unremarkable. Mild tortuosity is present in the mid cervical left internal carotid artery without significant stenosis. Vertebral arteries: Vertebral artery is originate from the subclavian arteries bilaterally without significant stenosis. Moderate tortuosity is present proximally without significant stenosis. The vertebral arteries are codominant. There is no significant stenosis of either vertebral artery in the neck. Skeleton: Degenerative endplate changes are most prominent at C5-6 on the right at C3-4 on the left. Vertebral body heights and alignment are maintained. No focal lytic or blastic lesions are present. The patient is edentulous. Other neck: No significant mucosal or  submucosal lesions are present. Salivary glands are within normal limits. There is no significant adenopathy. Upper chest: Mild dependent atelectasis is present at the lung apices bilaterally. No focal nodule or mass lesion is present. There is no significant airspace disease. The superior mediastinum is otherwise unremarkable. Review of the MIP images confirms the above findings CTA HEAD FINDINGS Anterior circulation: Atherosclerotic calcifications are presents within main cavernous internal carotid arteries bilaterally without significant stenosis. The terminal ICA is normal bilaterally. The A1 and M1 segments are intact. A proximal posterior right M2 occlusion is present. This limits opacification of posterior MCA branch vessels. Anterior MCA branch vessels are intact. ACA branch vessels are normal. Left MCA branch vessels are normal. Posterior circulation: The vertebral arteries are codominant. Atherosclerotic changes are present at the dural margins. The vertebrobasilar junction is normal. The basilar artery is normal. Both posterior cerebral arteries originate from basilar tip. PCA branch vessels are within normal limits. Venous sinuses: The dural sinuses are patent. The straight sinus deep cerebral veins are intact. The right transverse sinus is dominant. Anatomic variants: None Delayed phase: Not perform Review of the MIP images confirms the above findings IMPRESSION: 1. Right posterior M2 occlusion with marked attenuation of distal MCA branch vessels. 2. M1 segments are intact bilaterally. 3. Atherosclerotic calcifications at the cavernous internal carotid arteries bilaterally without significant stenoses. 4. Tortuosity of the cervical internal carotid arteries bilaterally without significant stenosis. 5. Mild degenerative changes of the cervical spine. Dr. Damita Dunnings discussed the findings of this study directly with Dr. Cheral Marker at the time of the exam. Electronically Signed   By: San Morelle  M.D.   On: 02/25/2017 10:56   Mr Virgel Paling CZ Contrast  Result Date: 02/26/2017 CLINICAL DATA:  76 year old female status post emergent Right MCA M2 occlusion, with symptomatic improvement following IV tPA. Initially presented with  sudden onset left side weakness, left face droop and rightward gaze. EXAM: MRI HEAD WITHOUT CONTRAST MRA HEAD WITHOUT CONTRAST TECHNIQUE: Multiplanar, multiecho pulse sequences of the brain and surrounding structures were obtained without intravenous contrast. Angiographic images of the head were obtained using MRA technique without contrast. COMPARISON:  CTA head and neck and noncontrast head CT 02/25/2017 FINDINGS: MRI HEAD FINDINGS Brain: Patchy an gyriform restricted diffusion in the posterior right MCA territory extending from the posterior half of the insula posteriorly and superiorly. The most confluent area of gray and white matter involvement measures 2-2.5 cm (series 3, image 31). Mild associated T2 and FLAIR hyperintensity. Abnormal susceptibility foci along the right sylvian fissure are probably intravascular thrombus (series 9, image 43). No parenchymal hemorrhage identified. No mass effect. No contralateral or posterior fossa restricted diffusion. No midline shift, mass effect, evidence of mass lesion, ventriculomegaly, extra-axial collection or acute intracranial hemorrhage. Cervicomedullary junction and pituitary are within normal limits. Outside of the acute findings there is mild patchy bilateral cerebral white matter T2 and FLAIR hyperintensity. No cortical encephalomalacia or definite chronic cerebral blood products. Deep gray matter nuclei, brainstem, and cerebellum are relatively normal for age. Vascular: Major intracranial vascular flow voids are preserved. Skull and upper cervical spine: Negative. Visualized bone marrow signal is within normal limits. Sinuses/Orbits: Normal orbits soft tissues. Mild to moderate mucosal thickening and bubbly opacity in the left  sphenoid sinus. Other: Mastoid air cells are clear. Visible internal auditory structures appear normal. Scalp and face soft tissues appear negative. MRA HEAD FINDINGS Antegrade flow in the posterior circulation with mildly dolichoectatic distal vertebral and basilar arteries. No stenosis. Patent right PICA and dominant appearing left AICA. SCA and PCA origins are normal aside from mild tortuosity. Posterior communicating arteries are diminutive or absent. Normal PCA branches. Antegrade flow in both ICA siphons. No siphon stenosis. Normal ophthalmic artery origins. Patent carotid termini. Normal MCA and ACA origins. Visible left MCA branches are within normal limits. Anterior communicating artery and visible bilateral ACA branches are within normal limits. Patent right MCA M1 segment. At the right MCA bifurcation the posterior right M2 branch remains occluded. The other right MCA M2 branches remain significantly attenuated. IMPRESSION: 1. No improvement in the posterior right MCA M2 branch occlusion and attenuation of other right MCA branches since the CTA yesterday. 2. However, a relatively small posterior right MCA territory core infarct is demonstrated, extending posteriorly in a patchy fashion from the posterior half of the insula. No associated hemorrhage or mass effect. 3. Only mild for age nonspecific white matter signal changes otherwise. Electronically Signed   By: Genevie Ann M.D.   On: 02/26/2017 13:02   Mr Brain Wo Contrast  Result Date: 02/26/2017 CLINICAL DATA:  76 year old female status post emergent Right MCA M2 occlusion, with symptomatic improvement following IV tPA. Initially presented with sudden onset left side weakness, left face droop and rightward gaze. EXAM: MRI HEAD WITHOUT CONTRAST MRA HEAD WITHOUT CONTRAST TECHNIQUE: Multiplanar, multiecho pulse sequences of the brain and surrounding structures were obtained without intravenous contrast. Angiographic images of the head were obtained using  MRA technique without contrast. COMPARISON:  CTA head and neck and noncontrast head CT 02/25/2017 FINDINGS: MRI HEAD FINDINGS Brain: Patchy an gyriform restricted diffusion in the posterior right MCA territory extending from the posterior half of the insula posteriorly and superiorly. The most confluent area of gray and white matter involvement measures 2-2.5 cm (series 3, image 31). Mild associated T2 and FLAIR hyperintensity. Abnormal susceptibility foci along  the right sylvian fissure are probably intravascular thrombus (series 9, image 43). No parenchymal hemorrhage identified. No mass effect. No contralateral or posterior fossa restricted diffusion. No midline shift, mass effect, evidence of mass lesion, ventriculomegaly, extra-axial collection or acute intracranial hemorrhage. Cervicomedullary junction and pituitary are within normal limits. Outside of the acute findings there is mild patchy bilateral cerebral white matter T2 and FLAIR hyperintensity. No cortical encephalomalacia or definite chronic cerebral blood products. Deep gray matter nuclei, brainstem, and cerebellum are relatively normal for age. Vascular: Major intracranial vascular flow voids are preserved. Skull and upper cervical spine: Negative. Visualized bone marrow signal is within normal limits. Sinuses/Orbits: Normal orbits soft tissues. Mild to moderate mucosal thickening and bubbly opacity in the left sphenoid sinus. Other: Mastoid air cells are clear. Visible internal auditory structures appear normal. Scalp and face soft tissues appear negative. MRA HEAD FINDINGS Antegrade flow in the posterior circulation with mildly dolichoectatic distal vertebral and basilar arteries. No stenosis. Patent right PICA and dominant appearing left AICA. SCA and PCA origins are normal aside from mild tortuosity. Posterior communicating arteries are diminutive or absent. Normal PCA branches. Antegrade flow in both ICA siphons. No siphon stenosis. Normal  ophthalmic artery origins. Patent carotid termini. Normal MCA and ACA origins. Visible left MCA branches are within normal limits. Anterior communicating artery and visible bilateral ACA branches are within normal limits. Patent right MCA M1 segment. At the right MCA bifurcation the posterior right M2 branch remains occluded. The other right MCA M2 branches remain significantly attenuated. IMPRESSION: 1. No improvement in the posterior right MCA M2 branch occlusion and attenuation of other right MCA branches since the CTA yesterday. 2. However, a relatively small posterior right MCA territory core infarct is demonstrated, extending posteriorly in a patchy fashion from the posterior half of the insula. No associated hemorrhage or mass effect. 3. Only mild for age nonspecific white matter signal changes otherwise. Electronically Signed   By: Genevie Ann M.D.   On: 02/26/2017 13:02   Ct Head Code Stroke Wo Contrast  Result Date: 02/25/2017 CLINICAL DATA:  Code stroke.  Left-sided weakness, onset at 9 a.m. EXAM: CT HEAD WITHOUT CONTRAST TECHNIQUE: Contiguous axial images were obtained from the base of the skull through the vertex without intravenous contrast. COMPARISON:  None. FINDINGS: Brain: No evidence of acute infarction, hemorrhage, hydrocephalus, extra-axial collection or mass lesion/mass effect. Vascular: Atherosclerotic calcification. Possible dense right M1 segment. Skull: No acute or aggressive finding. Sinuses/Orbits: Left sphenoid sinusitis with retained secretions. Other: Text page with prelim sent 02/25/2017 at 9:58 am to Dr. Cheral Marker. Awaiting call back. ASPECTS [facility] Stroke Program Early CT Score) - Ganglionic level infarction (caudate, lentiform nuclei, internal capsule, insula, M1-M3 cortex): 7 - Supraganglionic infarction (M4-M6 cortex): 3 Total score (0-10 with 10 being normal): 10 IMPRESSION: Possible dense/thrombosed right MCA. ASPECTS is 10. No acute hemorrhage. Electronically Signed   By:  Monte Fantasia M.D.   On: 02/25/2017 10:03    Assessment/Plan: Diagnosis: Right MCA infarct. Labs and images independently reviewed.  Records reviewed and summated above. Stroke: Continue secondary stroke prophylaxis and Risk Factor Modification listed below:   Antiplatelet therapy:   Blood Pressure Management:  Continue current medication with prn's with permisive HTN per primary team Statin Agent:    1. Does the need for close, 24 hr/day medical supervision in concert with the patient's rehab needs make it unreasonable for this patient to be served in a less intensive setting? Potentially 2. Co-Morbidities requiring supervision/potential complications: HTN (monitor and  provide prns in accordance with increased physical exertion and pain), diastolic dysfunction (monitor for signs/symptoms of fluid overload) 3. Due to safety, disease management and patient education, does the patient require 24 hr/day rehab nursing? Potentially 4. Does the patient require coordinated care of a physician, rehab nurse, PT (1-2 hrs/day, 5 days/week) and OT (1-2 hrs/day, 5 days/week) to address physical and functional deficits in the context of the above medical diagnosis(es)? Potentially Addressing deficits in the following areas: balance, endurance, locomotion, transferring, bathing, dressing and psychosocial support 5. Can the patient actively participate in an intensive therapy program of at least 3 hrs of therapy per day at least 5 days per week? Yes 6. The potential for patient to make measurable gains while on inpatient rehab is good 7. Anticipated functional outcomes upon discharge from inpatient rehab are modified independent and supervision  with PT, modified independent and supervision with OT, n/a with SLP. 8. Estimated rehab length of stay to reach the above functional goals is: 4-8 days. 9. Anticipated D/C setting: Home 10. Anticipated post D/C treatments: HH therapy and Home excercise  program 11. Overall Rehab/Functional Prognosis: good  RECOMMENDATIONS: This patient's condition is appropriate for continued rehabilitative care in the following setting: Will await completion of medical workup and reevaluation of therapies.  Pt may not require CIR. Patient has agreed to participate in recommended program. Potentially Note that insurance prior authorization may be required for reimbursement for recommended care.  Comment: Rehab Admissions Coordinator to follow up.  Delice Lesch, MD, Mellody Drown Cathlyn Parsons., PA-C 02/27/2017

## 2017-02-27 NOTE — Progress Notes (Addendum)
STROKE TEAM PROGRESS NOTE   HISTORY OF PRESENT ILLNESS (per record) Zoe Andrade is an 76 y.o. female who presented acutely to the ED via EMS as a Code Stroke with sudden onset of left sided weakness, left facial droop, rightward gaze deviation and slurred speech. She woke up this AM in her USOH. Later in the morning while sitting down at a restaurant with her husband she became unresponsive and then, when she started to speak again, the above symptoms were noted. Symptoms began at 0900. She has no prior history of stroke. She is not on an antiplatelet agent or a blood thinner. Her only home medications are Plendil and Tenormin.    LSN: [id number] tPA Given: Yes, administered IV t-PA at 02/25/2017 at [place] Medications:  Tenormin  Plendil  ROS: No headache or chest pain. Other ROS as per HPI. Detailed additional ROS deferred due to acuity of presentation.   Physical Examination: There were no vitals taken for this visit.  HEENT: [place]/AT Lungs: Respirations unlabored Ext: No edema  Patient was administered IV t-PA at 02/25/2017 at 1015.  She was admitted to the neuro ICU for further evaluation and treatment.   SUBJECTIVE (INTERVAL HISTORY) Her husband and one daughter are at the bedside. Neuro further improved. Visual field full today. No more simultagnosia. MRI showed left MCA infarct. TEE and LE venous doppler pending.   Pt fell twice today in room trying to walk by herself and going to bathroom. Told her that she is unsteady on her feet and can not walk without assistance. She is waiting for CIR placement   OBJECTIVE Temp:  [98.3 F (36.8 C)-99.5 F (37.5 C)] 98.4 F (36.9 C) (09/14 1832) Pulse Rate:  [62-71] 62 (09/14 1832) Cardiac Rhythm: Normal sinus rhythm (09/14 0852) Resp:  [18-20] 18 (09/14 1832) BP: (125-158)/(74-108) 151/98 (09/14 1832) SpO2:  [96 %-100 %] 96 % (09/14 1832) Weight:  [164 lb 4.8 oz (74.5 kg)] 164 lb 4.8 oz (74.5 kg) (09/13 2200)  CBC:    Recent Labs Lab 02/25/17 0946  02/26/17 0653 02/27/17 0530  WBC 12.5*  --  10.7* 9.2  NEUTROABS 6.6  --   --   --   HGB 14.1  < > 14.2 13.4  HCT 42.1  < > 41.5 40.0  MCV 89.4  --  89.1 90.3  PLT 185  --  173 170  < > = values in this interval not displayed.  Basic Metabolic Panel:   Recent Labs Lab 02/26/17 0653 02/27/17 0530  NA 135 138  K 3.5 3.5  CL 105 108  CO2 20* 22  GLUCOSE 105* 96  BUN 6 9  CREATININE 0.68 0.73  CALCIUM 9.1 9.0    Lipid Panel:     Component Value Date/Time   CHOL 186 02/26/2017 0208   TRIG 101 02/26/2017 0208   HDL 51 02/26/2017 0208   CHOLHDL 3.6 02/26/2017 0208   VLDL 20 02/26/2017 0208   LDLCALC 115 (H) 02/26/2017 0208   HgbA1c:  Lab Results  Component Value Date   HGBA1C 5.3 02/26/2017   Urine Drug Screen: No results found for: LABOPIA, COCAINSCRNUR, LABBENZ, AMPHETMU, THCU, LABBARB  Alcohol Level No results found for: [place] I have personally reviewed the radiological images below and agree with the radiology interpretations.  Ct Angio Head and neck W Or Wo Contrast 02/25/2017 IMPRESSION: 1. Right posterior M2 occlusion with marked attenuation of distal MCA branch vessels. 2. M1 segments are intact bilaterally. 3.  Atherosclerotic calcifications at the cavernous internal carotid arteries bilaterally without significant stenoses. 4. Tortuosity of the cervical internal carotid arteries bilaterally without significant stenosis. 5. Mild degenerative changes of the cervical spine.   Mri and Mra Head Wo Contrast 02/26/2017 IMPRESSION: 1. No improvement in the posterior right MCA M2 branch occlusion and attenuation of other right MCA branches since the CTA yesterday. 2. However, a relatively small posterior right MCA territory core infarct is demonstrated, extending posteriorly in a patchy fashion from the posterior half of the insula. No associated hemorrhage or mass effect. 3. Only mild for age nonspecific white matter signal  changes otherwise.   Ct Head Code Stroke Wo Contrast 02/25/2017 IMPRESSION: Possible dense/thrombosed right MCA. ASPECTS is 10. No acute hemorrhage.    TTE  - Left ventricle: The cavity size was normal. Systolic function was   normal. The estimated ejection fraction was in the range of 60%   to 65%. Wall motion was normal; there were no regional wall   motion abnormalities. Doppler parameters are consistent with   abnormal left ventricular relaxation (grade 1 diastolic   dysfunction). - Left atrium: The atrium was mildly dilated. Anterior-posterior   dimension: 41 mm. Impressions: - No cardiac source of emboli was indentified.  LE venous doppler pending  TEE pending   PHYSICAL EXAM  Temp:  [98.3 F (36.8 C)-99.5 F (37.5 C)] 98.4 F (36.9 C) (09/14 1832) Pulse Rate:  [62-71] 62 (09/14 1832) Resp:  [18-20] 18 (09/14 1832) BP: (125-158)/(74-108) 151/98 (09/14 1832) SpO2:  [96 %-100 %] 96 % (09/14 1832) Weight:  [164 lb 4.8 oz (74.5 kg)] 164 lb 4.8 oz (74.5 kg) (09/13 2200)  General - Well nourished, well developed, in no apparent distress.  Ophthalmologic - Sharp disc margins OU.   Cardiovascular - Regular rate and rhythm.  Mental Status -  Level of arousal and orientation to time, place, and person were intact. Language including expression, naming, repetition, comprehension was assessed and found intact. Fund of Knowledge was assessed and was intact.  Cranial Nerves II - XII - II - visual field full. III, IV, VI - Extraocular movements intact. V - Facial sensation intact bilaterally. VII - right nasolabial fold flattening. VIII - Hearing & vestibular intact bilaterally. X - Palate elevates symmetrically. XI - Chin turning & shoulder shrug intact bilaterally. XII - Tongue protrusion intact.  Motor Strength - The patient's strength was normal in all extremities except left hand dexterity difficulty. No pronator drift.  Bulk was normal and fasciculations were  absent.   Motor Tone - Muscle tone was assessed at the neck and appendages and was normal.  Reflexes - The patient's reflexes were 1+ in all extremities and she had no pathological reflexes.  Sensory - Light touch, temperature/pinprick were assessed and were symmetrical. However, with simultaneous stimulation bilaterally, pt had sensory neglect left side.  Coordination - The patient had normal movements in the hands with no ataxia or dysmetria.  Tremor was absent.  Gait and Station - deferred   ASSESSMENT/PLAN Ms. Lachandra Dettmann is a 76 y.o. female with history of HTN presenting with left sided weakness and right gaze, slurry speech. She received IV t-PA and symptoms much improved.   Stroke:  right MCA infarct, embolic, unclear source  Resultant  Left facial droop, left hand dexterity difficulty, left sensory neglect  CT head no acute abnormality  MRI head right MCA infarcts  MRA head right M2 persistent occlusion  CTA head and neck right M2 occlusion  2D Echo  EF 60-65%  LE venous doppler pending  Recommend TEE and loop record    LDL 115  HgbA1c 5.3  lovenox for VTE prophylaxis Diet NPO time specified Diet Heart Room service appropriate? Yes; Fluid consistency: Thin  No antithrombotic prior to admission, now on aspirin 325 mg daily.  Patient counseled to be compliant with her antithrombotic medications  Ongoing aggressive stroke risk factor management  Therapy recommendations:  CIR  Disposition:  pending  Hypertension  Stable Permissive hypertension (OK if < 180/105) but gradually normalize in 5-7 days Long-term BP goal normotensive  Hyperlipidemia  Home meds:  none  LDL 115, goal < 70  Add lipitor 20  Continue statin at discharge  Other Stroke Risk Factors  Advanced age  Other Active Problems  Hx of breast cancer - following with oncology, no recurrence  Hospital day # 2  Rosalin Hawking, MD PhD Stroke Neurology 02/27/2017 7:33 PM  To  contact Stroke Continuity provider, please refer to [email or web address] After hours, contact General Neurology

## 2017-02-28 ENCOUNTER — Inpatient Hospital Stay (HOSPITAL_COMMUNITY): Payer: Medicare HMO

## 2017-02-28 DIAGNOSIS — I639 Cerebral infarction, unspecified: Secondary | ICD-10-CM

## 2017-02-28 DIAGNOSIS — I6300 Cerebral infarction due to thrombosis of unspecified precerebral artery: Secondary | ICD-10-CM

## 2017-02-28 LAB — BASIC METABOLIC PANEL
Anion gap: 7 (ref 5–15)
BUN: 13 mg/dL (ref 6–20)
CALCIUM: 9 mg/dL (ref 8.9–10.3)
CO2: 24 mmol/L (ref 22–32)
CREATININE: 0.75 mg/dL (ref 0.44–1.00)
Chloride: 109 mmol/L (ref 101–111)
GFR calc non Af Amer: >60 mL/min (ref >60)
Glucose, Bld: 94 mg/dL (ref 65–99)
Potassium: 3.3 mmol/L — ABNORMAL LOW (ref 3.5–5.1)
SODIUM: 140 mmol/L (ref 135–145)

## 2017-02-28 LAB — CBC
HCT: 39.7 % (ref 36.0–46.0)
Hemoglobin: 13.3 g/dL (ref 12.0–15.0)
MCH: 30.2 pg (ref 26.0–34.0)
MCHC: 33.5 g/dL (ref 30.0–36.0)
MCV: 90 fL (ref 78.0–100.0)
PLATELETS: 165 10*3/uL (ref 150–400)
RBC: 4.41 MIL/uL (ref 3.87–5.11)
RDW: 13.2 % (ref 11.5–15.5)
WBC: 9.4 10*3/uL (ref 4.0–10.5)

## 2017-02-28 MED ORDER — POTASSIUM CHLORIDE 20 MEQ PO PACK
20.0000 meq | PACK | Freq: Two times a day (BID) | ORAL | Status: DC
  Administered 2017-02-28 (×2): 20 meq via ORAL
  Filled 2017-02-28 (×3): qty 1

## 2017-02-28 NOTE — Progress Notes (Signed)
Physical Therapy Treatment Patient Details Name: Zoe Andrade MRN: [id number] DOB: Sep 07, 1940 Today's Date: 02/28/2017    History of Present Illness 76 y.o. female who presented acutely to the ED via EMS as a Code Stroke with sudden onset of left sided weakness, left facial droop, rightward gaze deviation and slurred speech. baseline NIHSS of 17. tPA given. new baseline of NIHSS 3. MRI + No improvement in the posterior right MCA M2 branch occlusion and other MCA branches; R posterior MCA  infarct, extending posteriorly     PT Comments    Pt making steady progress with mobility; however, continues to demonstrate poor awareness of deficits and decreased safety awareness with decreased attention to L side. Pt was very independent PTA and became tearful when talking about activities that she previously enjoyed doing. Therapist provided emotional support and encouragement. Pt's daughter and husband present throughout session and were very supportive. Pt remains an excellent candidate for CIR to maximize her independence with functional mobility prior to returning home. Pt would continue to benefit from skilled physical therapy services at this time while admitted and after d/c to address the below listed limitations in order to improve overall safety and independence with functional mobility.   Follow Up Recommendations  CIR;Supervision/Assistance - 24 hour     Equipment Recommendations  Other (comment) (defer to next venue)    Recommendations for Other Services Rehab consult     Precautions / Restrictions Precautions Precautions: Fall Precaution Comments: poor awareness, decreased attention to L side Restrictions Weight Bearing Restrictions: No    Mobility  Bed Mobility Overal bed mobility: Needs Assistance Bed Mobility: Supine to Sit;Sit to Supine     Supine to sit: Min guard Sit to supine: Min guard   General bed mobility comments: min guard for safety  Transfers Overall  transfer level: Needs assistance Equipment used: None Transfers: Sit to/from Stand Sit to Stand: Min guard         General transfer comment: increased time, close min guard for safety  Ambulation/Gait Ambulation/Gait assistance: Min assist Ambulation Distance (Feet): 50 Feet (50' x5 with standing rest breaks) Assistive device: None Gait Pattern/deviations: Step-to pattern;Step-through pattern;Decreased step length - right;Decreased step length - left;Decreased stride length;Narrow base of support (holding bilateral UEs crossed over her stomach) Gait velocity: Decreased Gait velocity interpretation: Below normal speed for age/gender General Gait Details: pt very unsteady without use of an AD, requiring constant min A; pt with LOB x3 requring min A to maintain upright standing position. pt with decreased awareness to her L side and occasionally bumping into obstacles on L   Stairs            Wheelchair Mobility    Modified Rankin (Stroke Patients Only) Modified Rankin (Stroke Patients Only) Pre-Morbid Rankin Score: No symptoms Modified Rankin: Moderate disability     Balance Overall balance assessment: Needs assistance Sitting-balance support: Feet supported Sitting balance-Leahy Scale: Good     Standing balance support: During functional activity;No upper extremity supported Standing balance-Leahy Scale: Fair               High level balance activites: Turns;Head turns;Direction changes High Level Balance Comments: pt with significant instability with these higher level balance tasks; unable to maintain horizontal head turns while walking             Cognition Arousal/Alertness: Awake/alert Behavior During Therapy: WFL for tasks assessed/performed (tearful) Overall Cognitive Status: Impaired/Different from baseline Area of Impairment: Attention;Following commands;Safety/judgement;Awareness;Problem solving  Current Attention  Level: Sustained   Following Commands: Follows one step commands consistently;Follows multi-step commands inconsistently;Follows multi-step commands with increased time Safety/Judgement: Decreased awareness of safety;Decreased awareness of deficits Awareness: Emergent Problem Solving: Slow processing;Difficulty sequencing;Requires verbal cues General Comments: Pt alert and oriented x4      Exercises      General Comments        Pertinent Vitals/Pain Pain Assessment: No/denies pain    Home Living                      Prior Function            PT Goals (current goals can now be found in the care plan section) Acute Rehab PT Goals PT Goal Formulation: With patient/family Time For Goal Achievement: 03/12/17 Potential to Achieve Goals: Good Progress towards PT goals: Progressing toward goals    Frequency    Min 4X/week      PT Plan Current plan remains appropriate    Co-evaluation              AM-PAC PT "6 Clicks" Daily Activity  Outcome Measure  Difficulty turning over in bed (including adjusting bedclothes, sheets and blankets)?: None Difficulty moving from lying on back to sitting on the side of the bed? : None Difficulty sitting down on and standing up from a chair with arms (e.g., wheelchair, bedside commode, etc,.)?: A Little Help needed moving to and from a bed to chair (including a wheelchair)?: A Little Help needed walking in hospital room?: A Little Help needed climbing 3-5 steps with a railing? : A Lot 6 Click Score: 19    End of Session Equipment Utilized During Treatment: Gait belt Activity Tolerance: Patient tolerated treatment well Patient left: in bed;with bed alarm set;with call bell/phone within reach;with family/visitor present Nurse Communication: Mobility status PT Visit Diagnosis: Unsteadiness on feet (R26.81);Hemiplegia and hemiparesis Hemiplegia - Right/Left: Left Hemiplegia - dominant/non-dominant:  Non-dominant Hemiplegia - caused by: Cerebral infarction     Time: 7672-0947 PT Time Calculation (min) (ACUTE ONLY): 24 min  Charges:  $Gait Training: 8-22 mins $Therapeutic Activity: 8-22 mins                    G Codes:       [place], [place], [place] 02/28/2017, 11:12 AM

## 2017-02-28 NOTE — Progress Notes (Signed)
STROKE TEAM PROGRESS NOTE   HISTORY OF PRESENT ILLNESS (per record) Zoe Andrade is an 76 y.o. female who presented acutely to the ED via EMS as a Code Stroke with sudden onset of left sided weakness, left facial droop, rightward gaze deviation and slurred speech. She woke up this AM in her USOH. Later in the morning while sitting down at a restaurant with her husband she became unresponsive and then, when she started to speak again, the above symptoms were noted. Symptoms began at 0900. She has no prior history of stroke. She is not on an antiplatelet agent or a blood thinner. Her only home medications are Plendil and Tenormin.    LSN: [id number] tPA Given: Yes, administered IV t-PA at 02/25/2017 at 1015       She was admitted to the neuro ICU for further evaluation and treatment.   SUBJECTIVE (INTERVAL HISTORY) Her husband and one daughter are at the bedside. She still has mild left leg weakness.  TEE and LE venous doppler pending.  She is waiting for CIR placement   OBJECTIVE Temp:  [98.1 F (36.7 C)-99.2 F (37.3 C)] 98.1 F (36.7 C) (09/15 0954) Pulse Rate:  [62-84] 84 (09/15 0954) Cardiac Rhythm: Normal sinus rhythm;Heart block (09/15 0700) Resp:  [16-20] 16 (09/15 0954) BP: (143-162)/(73-98) 159/89 (09/15 0954) SpO2:  [94 %-98 %] 97 % (09/15 0954)  CBC:   Recent Labs Lab 02/25/17 0946  02/27/17 0530 02/28/17 0246  WBC 12.5*  < > 9.2 9.4  NEUTROABS 6.6  --   --   --   HGB 14.1  < > 13.4 13.3  HCT 42.1  < > 40.0 39.7  MCV 89.4  < > 90.3 90.0  PLT 185  < > 170 165  < > = values in this interval not displayed.  Basic Metabolic Panel:   Recent Labs Lab 02/27/17 0530 02/28/17 0246  NA 138 140  K 3.5 3.3*  CL 108 109  CO2 22 24  GLUCOSE 96 94  BUN 9 13  CREATININE 0.73 0.75  CALCIUM 9.0 9.0    Lipid Panel:     Component Value Date/Time   CHOL 186 02/26/2017 0208   TRIG 101 02/26/2017 0208   HDL 51 02/26/2017 0208   CHOLHDL 3.6 02/26/2017 0208   VLDL 20  02/26/2017 0208   LDLCALC 115 (H) 02/26/2017 0208   HgbA1c:  Lab Results  Component Value Date   HGBA1C 5.3 02/26/2017   Urine Drug Screen: No results found for: LABOPIA, COCAINSCRNUR, LABBENZ, AMPHETMU, THCU, LABBARB  Alcohol Level No results found for: [place] I have personally reviewed the radiological images below and agree with the radiology interpretations.  Ct Angio Head and neck W Or Wo Contrast 02/25/2017 IMPRESSION: 1. Right posterior M2 occlusion with marked attenuation of distal MCA branch vessels. 2. M1 segments are intact bilaterally. 3. Atherosclerotic calcifications at the cavernous internal carotid arteries bilaterally without significant stenoses. 4. Tortuosity of the cervical internal carotid arteries bilaterally without significant stenosis. 5. Mild degenerative changes of the cervical spine.   Mri and Mra Head Wo Contrast 02/26/2017 IMPRESSION: 1. No improvement in the posterior right MCA M2 branch occlusion and attenuation of other right MCA branches since the CTA yesterday. 2. However, a relatively small posterior right MCA territory core infarct is demonstrated, extending posteriorly in a patchy fashion from the posterior half of the insula. No associated hemorrhage or mass effect. 3. Only mild for age nonspecific white matter signal changes otherwise.  Ct Head Code Stroke Wo Contrast 02/25/2017 IMPRESSION: Possible dense/thrombosed right MCA. ASPECTS is 10. No acute hemorrhage.    TTE  - Left ventricle: The cavity size was normal. Systolic function was   normal. The estimated ejection fraction was in the range of 60%   to 65%. Wall motion was normal; there were no regional wall   motion abnormalities. Doppler parameters are consistent with   abnormal left ventricular relaxation (grade 1 diastolic   dysfunction). - Left atrium: The atrium was mildly dilated. Anterior-posterior   dimension: 41 mm. Impressions: - No cardiac source of emboli was  indentified.  LE venous doppler pending  TEE pending   PHYSICAL EXAM  Temp:  [98.1 F (36.7 C)-99.2 F (37.3 C)] 98.1 F (36.7 C) (09/15 0954) Pulse Rate:  [62-84] 84 (09/15 0954) Resp:  [16-20] 16 (09/15 0954) BP: (143-162)/(73-98) 159/89 (09/15 0954) SpO2:  [94 %-98 %] 97 % (09/15 0954)  General - Well nourished, well developed elderly caucasian lady not in distress,  Ophthalmologic - Sharp disc margins OU.   Cardiovascular - Regular rate and rhythm.  Mental Status -  Level of arousal and orientation to time, place, and person were intact. Language including expression, naming, repetition, comprehension was assessed and found intact. Fund of Knowledge was assessed and was intact.  Cranial Nerves II - XII - II - visual field full. III, IV, VI - Extraocular movements intact. V - Facial sensation intact bilaterally. VII - right nasolabial fold flattening. VIII - Hearing & vestibular intact bilaterally. X - Palate elevates symmetrically. XI - Chin turning & shoulder shrug intact bilaterally. XII - Tongue protrusion intact.  Motor Strength - The patient's strength was normal in all extremities  diminished fine finger movements on the left. Orbits right over left upper extremity.. No pronator drift.  Bulk was normal and fasciculations were absent.  Mild weakness of left hip flexors and mild left upper extremity drift on simultaneous testing of both legs Motor Tone - Muscle tone was assessed at the neck and appendages and was normal.  Reflexes - The patient's reflexes were 1+ in all extremities and she had no pathological reflexes.  Sensory - Light touch, temperature/pinprick were assessed and were symmetrical.   Coordination - The patient had normal movements in the hands with no ataxia or dysmetria.  Tremor was absent.  Gait and Station - deferred   ASSESSMENT/PLAN [name] is a 76 y.o. female with history of HTN presenting with left sided weakness and  right gaze, slurry speech. She received IV t-PA and symptoms much improved.   Stroke:  right MCA infarct, embolic, unclear source  Resultant  Left facial droop, left hand dexterity difficulty, left sensory neglect  CT head no acute abnormality  MRI head right MCA infarcts  MRA head right M2 persistent occlusion  CTA head and neck right M2 occlusion  2D Echo  EF 60-65%  LE venous doppler no DVT Recommend TEE and loop recorder    LDL 115  HgbA1c 5.3  lovenox for VTE prophylaxis Diet NPO time specified Diet NPO time specified Except for: Sips with Meds Diet Heart Room service appropriate? Yes; Fluid consistency: Thin  No antithrombotic prior to admission, now on aspirin 325 mg daily.  Patient counseled to be compliant with her antithrombotic medications  Ongoing aggressive stroke risk factor management  Therapy recommendations:  CIR  Disposition:  pending  Hypertension  Stable Permissive hypertension (OK if < 180/105) but gradually normalize in 5-7 days Long-term BP  goal normotensive  Hyperlipidemia  Home meds:  none  LDL 115, goal < 70  Add lipitor 20  Continue statin at discharge  Other Stroke Risk Factors  Advanced age  Other Active Problems  Hx of breast cancer - following with oncology, no recurrence  Hospital day # 3 I have personally examined this patient, reviewed notes, independently viewed imaging studies, participated in medical decision making and plan of care.ROS completed by me personally and pertinent positives fully documented  I have made any additions or clarifications directly to the above note.  Long discussion with the patient, husband and daughter regarding her pending workup of TEE and loop recorder and likely discharge to inpatient rehabilitation over the next few days. Greater than 50% time during this 35 minute visit was spent on counseling and coordination of category embolic stroke and discussion about evaluation, prevention and  treatment  Antony Contras, MD Medical Director [place] Pager: [phone] 02/28/2017 12:50 PM   To contact Stroke Continuity provider, please refer to [email or web address] After hours, contact General Neurology

## 2017-02-28 NOTE — Progress Notes (Signed)
Patient with noted abrasion on right antecubital with redness surrounding abrasion.  Patient family stated ems began IV during transport from home without knowledge of this being her restricted extremity due to prior breast surgery.  Patient family feels the abrasion was caused by tape removal during IV removal.  Cleaned with Normal saline, covered with dry dressing.  Patient family also stated patient had a "blister" on right hip that had "popped" by itself also leaving behind abrasion and redness.  Cleaned with normal saline covered by tegaderm.  Will continue to monitor.

## 2017-02-28 NOTE — Progress Notes (Signed)
PT Cancellation Note  Patient Details Name: Zoe Andrade MRN: [id number] DOB: 07/17/1940   Cancelled Treatment:    Reason Eval/Treat Not Completed: Patient at procedure or test/unavailable. Pt off the floor for a vascular procedure. PT will continue to f/u with pt as available.   Clearnce Sorrel Madisin Hasan 02/28/2017, 9:41 AM

## 2017-02-28 NOTE — Progress Notes (Signed)
VASCULAR LAB PRELIMINARY  PRELIMINARY  PRELIMINARY  PRELIMINARY  Bilateral lower extremity venous duplex completed.    Preliminary report:  There is no DVT or SVT noted in the bilateral lower extremities.   Clarissa Laird, RVT 02/28/2017, 9:39 AM

## 2017-03-01 LAB — CBC
HCT: 38.8 % (ref 36.0–46.0)
Hemoglobin: 13.1 g/dL (ref 12.0–15.0)
MCH: 30.4 pg (ref 26.0–34.0)
MCHC: 33.8 g/dL (ref 30.0–36.0)
MCV: 90 fL (ref 78.0–100.0)
PLATELETS: 167 10*3/uL (ref 150–400)
RBC: 4.31 MIL/uL (ref 3.87–5.11)
RDW: 13.2 % (ref 11.5–15.5)
WBC: 9.1 10*3/uL (ref 4.0–10.5)

## 2017-03-01 LAB — BASIC METABOLIC PANEL
ANION GAP: 8 (ref 5–15)
BUN: 12 mg/dL (ref 6–20)
CALCIUM: 9.1 mg/dL (ref 8.9–10.3)
CO2: 23 mmol/L (ref 22–32)
Chloride: 108 mmol/L (ref 101–111)
Creatinine, Ser: 0.74 mg/dL (ref 0.44–1.00)
Glucose, Bld: 84 mg/dL (ref 65–99)
Potassium: 4 mmol/L (ref 3.5–5.1)
Sodium: 139 mmol/L (ref 135–145)

## 2017-03-01 MED ORDER — BACITRACIN-NEOMYCIN-POLYMYXIN OINTMENT TUBE
TOPICAL_OINTMENT | Freq: Three times a day (TID) | CUTANEOUS | Status: DC
Start: 2017-03-01 — End: 2017-03-02
  Administered 2017-03-01: 17:00:00 via TOPICAL
  Administered 2017-03-01: 1 via TOPICAL
  Filled 2017-03-01 (×4): qty 1

## 2017-03-01 MED ORDER — POTASSIUM CHLORIDE CRYS ER 20 MEQ PO TBCR
20.0000 meq | EXTENDED_RELEASE_TABLET | Freq: Two times a day (BID) | ORAL | Status: DC
  Administered 2017-03-01 (×2): 20 meq via ORAL
  Filled 2017-03-01 (×3): qty 1

## 2017-03-01 MED ORDER — CEPHALEXIN 500 MG PO CAPS
500.0000 mg | ORAL_CAPSULE | Freq: Two times a day (BID) | ORAL | Status: DC
  Filled 2017-03-01 (×2): qty 1

## 2017-03-01 NOTE — Progress Notes (Signed)
STROKE TEAM PROGRESS NOTE   HISTORY OF PRESENT ILLNESS (per record) Zoe Andrade is an 76 y.o. female who presented acutely to the ED via EMS as a Code Stroke with sudden onset of left sided weakness, left facial droop, rightward gaze deviation and slurred speech. She woke up this AM in her USOH. Later in the morning while sitting down at a restaurant with her husband she became unresponsive and then, when she started to speak again, the above symptoms were noted. Symptoms began at 0900. She has no prior history of stroke. She is not on an antiplatelet agent or a blood thinner. Her only home medications are Plendil and Tenormin.    LSN: [id number] tPA Given: Yes, administered IV t-PA at 02/25/2017 at 1015       She was admitted to the neuro ICU for further evaluation and treatment.   SUBJECTIVE (INTERVAL HISTORY) Her husband and one daughter are at the bedside. She Feels she is doing better and has improved left-sided strength..  She is waiting for CIR placement. She complained of redness in the right antecubital region at the site of tape removal from an IV which had been placed by EMS.   OBJECTIVE Temp:  [98.1 F (36.7 C)-98.8 F (37.1 C)] 98.7 F (37.1 C) (09/16 0515) Pulse Rate:  [57-84] 58 (09/16 0515) Cardiac Rhythm: Normal sinus rhythm (09/16 0700) Resp:  [16-18] 18 (09/16 0515) BP: (137-159)/(85-100) 153/100 (09/16 0515) SpO2:  [97 %-98 %] 98 % (09/16 0515)  CBC:   Recent Labs Lab 02/25/17 0946  02/28/17 0246 03/01/17 0416  WBC 12.5*  < > 9.4 9.1  NEUTROABS 6.6  --   --   --   HGB 14.1  < > 13.3 13.1  HCT 42.1  < > 39.7 38.8  MCV 89.4  < > 90.0 90.0  PLT 185  < > 165 167  < > = values in this interval not displayed.  Basic Metabolic Panel:   Recent Labs Lab 02/28/17 0246 03/01/17 0416  NA 140 139  K 3.3* 4.0  CL 109 108  CO2 24 23  GLUCOSE 94 84  BUN 13 12  CREATININE 0.75 0.74  CALCIUM 9.0 9.1    Lipid Panel:     Component Value Date/Time   CHOL  186 02/26/2017 0208   TRIG 101 02/26/2017 0208   HDL 51 02/26/2017 0208   CHOLHDL 3.6 02/26/2017 0208   VLDL 20 02/26/2017 0208   LDLCALC 115 (H) 02/26/2017 0208   HgbA1c:  Lab Results  Component Value Date   HGBA1C 5.3 02/26/2017   Urine Drug Screen: No results found for: LABOPIA, COCAINSCRNUR, LABBENZ, AMPHETMU, THCU, LABBARB  Alcohol Level No results found for: [place] I have personally reviewed the radiological images below and agree with the radiology interpretations.  Ct Angio Head and neck W Or Wo Contrast 02/25/2017 IMPRESSION: 1. Right posterior M2 occlusion with marked attenuation of distal MCA branch vessels. 2. M1 segments are intact bilaterally. 3. Atherosclerotic calcifications at the cavernous internal carotid arteries bilaterally without significant stenoses. 4. Tortuosity of the cervical internal carotid arteries bilaterally without significant stenosis. 5. Mild degenerative changes of the cervical spine.   Mri and Mra Head Wo Contrast 02/26/2017 IMPRESSION: 1. No improvement in the posterior right MCA M2 branch occlusion and attenuation of other right MCA branches since the CTA yesterday. 2. However, a relatively small posterior right MCA territory core infarct is demonstrated, extending posteriorly in a patchy fashion from the posterior half  of the insula. No associated hemorrhage or mass effect. 3. Only mild for age nonspecific white matter signal changes otherwise.   Ct Head Code Stroke Wo Contrast 02/25/2017 IMPRESSION: Possible dense/thrombosed right MCA. ASPECTS is 10. No acute hemorrhage.    TTE  - Left ventricle: The cavity size was normal. Systolic function was   normal. The estimated ejection fraction was in the range of 60%   to 65%. Wall motion was normal; there were no regional wall   motion abnormalities. Doppler parameters are consistent with   abnormal left ventricular relaxation (grade 1 diastolic   dysfunction). - Left atrium: The atrium was  mildly dilated. Anterior-posterior   dimension: 41 mm. Impressions: - No cardiac source of emboli was indentified.  LE  No evidence of deep vein or superficial thrombosis involving the right lower extremity and left lower extremity.  TEE pending   PHYSICAL EXAM  Temp:  [98.1 F (36.7 C)-98.8 F (37.1 C)] 98.7 F (37.1 C) (09/16 0515) Pulse Rate:  [57-84] 58 (09/16 0515) Resp:  [16-18] 18 (09/16 0515) BP: (137-159)/(85-100) 153/100 (09/16 0515) SpO2:  [97 %-98 %] 98 % (09/16 0515)  General - Well nourished, well developed elderly caucasian lady not in distress,  Ophthalmologic - Sharp disc margins OU.  Skin -slight erythema and abrasion in the right forearm  at the site of tape removal  Cardiovascular - Regular rate and rhythm.  Mental Status -  Level of arousal and orientation to time, place, and person were intact. Language including expression, naming, repetition, comprehension was assessed and found intact. Fund of Knowledge was assessed and was intact.  Cranial Nerves II - XII - II - visual field full. III, IV, VI - Extraocular movements intact. V - Facial sensation intact bilaterally. VII - right nasolabial fold flattening. VIII - Hearing & vestibular intact bilaterally. X - Palate elevates symmetrically. XI - Chin turning & shoulder shrug intact bilaterally. XII - Tongue protrusion intact.  Motor Strength - The patient's strength was normal in all extremities  diminished fine finger movements on the left. Orbits right over left upper extremity.. No pronator drift.  Bulk was normal and fasciculations were absent.  Mild weakness of left hip flexors and mild left upper extremity drift on simultaneous testing of both legs Motor Tone - Muscle tone was assessed at the neck and appendages and was normal.  Reflexes - The patient's reflexes were 1+ in all extremities and she had no pathological reflexes.  Sensory - Light touch, temperature/pinprick were assessed and were  symmetrical.   Coordination - The patient had normal movements in the hands with no ataxia or dysmetria.  Tremor was absent.  Gait and Station - deferred   ASSESSMENT/PLAN [name] is a 76 y.o. female with history of HTN presenting with left sided weakness and right gaze, slurry speech. She received IV t-PA and symptoms much improved.   Stroke:  right MCA infarct, embolic, unclear source  Resultant  Left facial droop, left hand dexterity difficulty, left sensory neglect  CT head no acute abnormality  MRI head right MCA infarcts  MRA head right M2 persistent occlusion  CTA head and neck right M2 occlusion  2D Echo  EF 60-65%  LE venous doppler no DVT Recommend TEE and loop recorder    LDL 115  HgbA1c 5.3  lovenox for VTE prophylaxis Diet NPO time specified Diet NPO time specified Except for: Sips with Meds Diet Heart Room service appropriate? Yes; Fluid consistency: Thin  No antithrombotic  prior to admission, now on aspirin 325 mg daily.  Patient counseled to be compliant with her antithrombotic medications  Ongoing aggressive stroke risk factor management  Therapy recommendations:  CIR  Disposition:  pending  Hypertension  Stable Permissive hypertension (OK if < 180/105) but gradually normalize in 5-7 days Long-term BP goal normotensive  Hyperlipidemia  Home meds:  none  LDL 115, goal < 70  Add lipitor 20  Continue statin at discharge  Other Stroke Risk Factors  Advanced age  Other Active Problems  Hx of breast cancer - following with oncology, no recurrence  Hospital day # 4 I have personally examined this patient, reviewed notes, independently viewed imaging studies, participated in medical decision making and plan of care.ROS completed by me personally and pertinent positives fully documented  I have made any additions or clarifications directly to the above note.  Long discussion with the patient, husband and daughter regarding  her pending workup of TEE and loop recorder and likely discharge to inpatient rehabilitation over the next few days. If she continues to improve she may return go home with therapy. Greater than 50% time during this 25 minute visit was spent on counseling and coordination of category embolic stroke and discussion about evaluation, prevention and treatment Antony Contras, MD Medical Director [facility] Pager: [phone] 03/01/2017 12:00 PM   To contact Stroke Continuity provider, please refer to [email or web address] After hours, contact General Neurology

## 2017-03-02 ENCOUNTER — Encounter (HOSPITAL_COMMUNITY): Payer: Self-pay | Admitting: *Deleted

## 2017-03-02 ENCOUNTER — Inpatient Hospital Stay (HOSPITAL_COMMUNITY): Payer: Medicare HMO

## 2017-03-02 ENCOUNTER — Encounter (HOSPITAL_COMMUNITY)
Admission: EM | Disposition: A | Discharge status: Home Health | Payer: Self-pay | Source: Home / Self Care | Attending: Neurology

## 2017-03-02 DIAGNOSIS — I638 Other cerebral infarction: Secondary | ICD-10-CM

## 2017-03-02 DIAGNOSIS — I361 Nonrheumatic tricuspid (valve) insufficiency: Secondary | ICD-10-CM

## 2017-03-02 HISTORY — PX: TEE WITHOUT CARDIOVERSION: SHX5443

## 2017-03-02 LAB — BASIC METABOLIC PANEL
Anion gap: 4 — ABNORMAL LOW (ref 5–15)
BUN: 11 mg/dL (ref 6–20)
CALCIUM: 9.2 mg/dL (ref 8.9–10.3)
CO2: 25 mmol/L (ref 22–32)
Chloride: 109 mmol/L (ref 101–111)
Creatinine, Ser: 0.71 mg/dL (ref 0.44–1.00)
GFR calc non Af Amer: >60 mL/min (ref >60)
Glucose, Bld: 107 mg/dL — ABNORMAL HIGH (ref 65–99)
Potassium: 3.7 mmol/L (ref 3.5–5.1)
SODIUM: 138 mmol/L (ref 135–145)

## 2017-03-02 LAB — CBC
HCT: 38.2 % (ref 36.0–46.0)
HEMOGLOBIN: 13 g/dL (ref 12.0–15.0)
MCH: 30.4 pg (ref 26.0–34.0)
MCHC: 34 g/dL (ref 30.0–36.0)
MCV: 89.3 fL (ref 78.0–100.0)
PLATELETS: 164 10*3/uL (ref 150–400)
RBC: 4.28 MIL/uL (ref 3.87–5.11)
RDW: 13 % (ref 11.5–15.5)
WBC: 8.1 10*3/uL (ref 4.0–10.5)

## 2017-03-02 SURGERY — ECHOCARDIOGRAM, TRANSESOPHAGEAL
Anesthesia: Moderate Sedation

## 2017-03-02 MED ORDER — DIPHENHYDRAMINE HCL 50 MG/ML IJ SOLN
INTRAMUSCULAR | Status: AC
  Filled 2017-03-02: qty 1

## 2017-03-02 MED ORDER — HYDRALAZINE HCL 20 MG/ML IJ SOLN
INTRAMUSCULAR | Status: DC | PRN
  Administered 2017-03-02: 10 mg via INTRAVENOUS

## 2017-03-02 MED ORDER — ATENOLOL 25 MG PO TABS: 12.5000 mg | ORAL_TABLET | Freq: Every day | ORAL | 0 refills | Status: DC

## 2017-03-02 MED ORDER — FENTANYL CITRATE (PF) 100 MCG/2ML IJ SOLN
INTRAMUSCULAR | Status: DC | PRN
  Administered 2017-03-02 (×2): 12.5 ug via INTRAVENOUS
  Administered 2017-03-02: 25 ug via INTRAVENOUS

## 2017-03-02 MED ORDER — METOPROLOL TARTRATE 5 MG/5ML IV SOLN
INTRAVENOUS | Status: DC | PRN
  Administered 2017-03-02: 5 mg via INTRAVENOUS

## 2017-03-02 MED ORDER — CEPHALEXIN 500 MG PO CAPS: 500.0000 mg | ORAL_CAPSULE | Freq: Two times a day (BID) | ORAL | 0 refills | Status: AC

## 2017-03-02 MED ORDER — ATORVASTATIN CALCIUM 20 MG PO TABS: 20.0000 mg | ORAL_TABLET | Freq: Every day | ORAL | 0 refills | Status: DC

## 2017-03-02 MED ORDER — MIDAZOLAM HCL 10 MG/2ML IJ SOLN
INTRAMUSCULAR | Status: DC | PRN
  Administered 2017-03-02: 2 mg via INTRAVENOUS
  Administered 2017-03-02: 1 mg via INTRAVENOUS
  Administered 2017-03-02: 2 mg via INTRAVENOUS

## 2017-03-02 MED ORDER — ASPIRIN 325 MG PO TBEC: 325.0000 mg | DELAYED_RELEASE_TABLET | Freq: Every day | ORAL | 0 refills | Status: DC

## 2017-03-02 MED ORDER — SODIUM CHLORIDE 0.9 % IV SOLN: INTRAVENOUS | Status: DC

## 2017-03-02 MED ORDER — HYDRALAZINE HCL 20 MG/ML IJ SOLN
INTRAMUSCULAR | Status: AC
  Filled 2017-03-02: qty 1

## 2017-03-02 MED ORDER — LIDOCAINE VISCOUS 2 % MT SOLN
OROMUCOSAL | Status: AC
  Filled 2017-03-02: qty 15

## 2017-03-02 MED ORDER — IOPAMIDOL (ISOVUE-370) INJECTION 76%
INTRAVENOUS | Status: AC
  Administered 2017-03-02: 100 mL
  Filled 2017-03-02: qty 100

## 2017-03-02 MED ORDER — MIDAZOLAM HCL 5 MG/ML IJ SOLN
INTRAMUSCULAR | Status: AC
  Filled 2017-03-02: qty 2

## 2017-03-02 MED ORDER — METOPROLOL TARTRATE 5 MG/5ML IV SOLN
INTRAVENOUS | Status: AC
  Filled 2017-03-02: qty 5

## 2017-03-02 MED ORDER — BACITRACIN-NEOMYCIN-POLYMYXIN 400-5-5000 EX OINT: TOPICAL_OINTMENT | Freq: Three times a day (TID) | CUTANEOUS | 0 refills | Status: DC

## 2017-03-02 MED ORDER — BUTAMBEN-TETRACAINE-BENZOCAINE 2-2-14 % EX AERO
INHALATION_SPRAY | CUTANEOUS | Status: DC | PRN
Start: 2017-03-02 — End: 2017-03-02
  Administered 2017-03-02: 2 via TOPICAL

## 2017-03-02 MED ORDER — FENTANYL CITRATE (PF) 100 MCG/2ML IJ SOLN
INTRAMUSCULAR | Status: AC
  Filled 2017-03-02: qty 2

## 2017-03-02 NOTE — Progress Notes (Signed)
CSW met with patient and patient's daughter to discuss discharge planning. CSW explained secondary option of SNF placement, if CIR receives a denial from insurance. Patient was not willing to consider SNF placement. Patient and patient's daughter discussed options for returning home, including equipment needs and request for home health services and private duty caregiving. CSW provided information to [facility].  CSW signing off.  Laveda Abbe, [place] Clinical Social Worker [phone]

## 2017-03-02 NOTE — Discharge Summary (Signed)
Stroke Discharge Summary  Patient ID: Nolie Bignell   MRN: [id number]      DOB: 1940-11-11  Date of Admission: 02/25/2017 Date of Discharge: 03/02/2017  Attending Physician:  Garvin Fila, MD, Stroke MD Consultant(s):   Treatment Team:  [facility], [place], MD rehabilitation medicine Patient's PCP:  Patient, No Pcp Per  DISCHARGE DIAGNOSIS: Principal Problem:   Stroke (cerebrum) ([place]) - right MCA infarct, embolic, unclear source Active Problems:   Benign essential HTN   Diastolic dysfunction   Past Medical History:  Diagnosis Date  . Cancer ([place])   . Hypertension    Past Surgical History:  Procedure Laterality Date  . MASTECTOMY Right     Allergies as of 03/02/2017   No Known Allergies     Medication List    TAKE these medications   aspirin 325 MG EC tablet Take 1 tablet (325 mg total) by mouth daily.   atenolol 25 MG tablet Commonly known as:  TENORMIN Take 0.5 tablets (12.5 mg total) by mouth daily. What changed:  medication strength  how much to take   atorvastatin 20 MG tablet Commonly known as:  LIPITOR Take 1 tablet (20 mg total) by mouth daily at 6 PM.   cephALEXin 500 MG capsule Commonly known as:  KEFLEX Take 1 capsule (500 mg total) by mouth every 12 (twelve) hours.   felodipine 10 MG 24 hr tablet Commonly known as:  PLENDIL Take 10 mg by mouth daily.   neomycin-bacitracin-polymyxin ointment Commonly known as:  NEOSPORIN Apply topically 3 (three) times daily. apply to eye            Discharge Care Instructions        Start     Ordered   03/02/17 0000  aspirin EC 325 MG EC tablet  Daily     03/02/17 1230   03/02/17 0000  atorvastatin (LIPITOR) 20 MG tablet  Daily-1800     03/02/17 1231   03/02/17 0000  atenolol (TENORMIN) 25 MG tablet  Daily     03/02/17 1231   03/02/17 0000  cephALEXin (KEFLEX) 500 MG capsule  Every 12 hours     03/02/17 1231   03/02/17 0000  neomycin-bacitracin-polymyxin (NEOSPORIN) ointment  3  times daily     03/02/17 1232   03/02/17 0000  Ambulatory referral to Neurology    Comments:  An appointment with Cecille Rubin, NP is requested in approximately: 6 - 8 weeks   03/02/17 1233   02/27/17 0000  Ambulatory referral to Physical Medicine Rehab    Comments:  1 month stroke follow up   02/27/17 0920      LABORATORY STUDIES CBC    Component Value Date/Time   WBC 8.1 03/02/2017 0144   RBC 4.28 03/02/2017 0144   HGB 13.0 03/02/2017 0144   HCT 38.2 03/02/2017 0144   PLT 164 03/02/2017 0144   MCV 89.3 03/02/2017 0144   MCH 30.4 03/02/2017 0144   MCHC 34.0 03/02/2017 0144   RDW 13.0 03/02/2017 0144   LYMPHSABS 4.5 (H) 02/25/2017 0946   MONOABS 1.1 (H) 02/25/2017 0946   EOSABS 0.3 02/25/2017 0946   BASOSABS 0.0 02/25/2017 0946   CMP    Component Value Date/Time   NA 138 03/02/2017 0144   K 3.7 03/02/2017 0144   CL 109 03/02/2017 0144   CO2 25 03/02/2017 0144   GLUCOSE 107 (H) 03/02/2017 0144   BUN 11 03/02/2017 0144   CREATININE 0.71 03/02/2017 0144   CALCIUM  9.2 03/02/2017 0144   PROT 7.0 02/25/2017 0946   ALBUMIN 4.0 02/25/2017 0946   AST 23 02/25/2017 0946   ALT 21 02/25/2017 0946   ALKPHOS 87 02/25/2017 0946   BILITOT 0.7 02/25/2017 0946   GFRNONAA >60 03/02/2017 0144   GFRAA >60 03/02/2017 0144   COAGS Lab Results  Component Value Date   INR 0.92 02/25/2017   Lipid Panel    Component Value Date/Time   CHOL 186 02/26/2017 0208   TRIG 101 02/26/2017 0208   HDL 51 02/26/2017 0208   CHOLHDL 3.6 02/26/2017 0208   VLDL 20 02/26/2017 0208   LDLCALC 115 (H) 02/26/2017 0208   HgbA1C  Lab Results  Component Value Date   HGBA1C 5.3 02/26/2017   Urinalysis No results found for: COLORURINE, APPEARANCEUR, LABSPEC, PHURINE, GLUCOSEU, HGBUR, BILIRUBINUR, KETONESUR, PROTEINUR, UROBILINOGEN, NITRITE, LEUKOCYTESUR Urine Drug Screen No results found for: LABOPIA, COCAINSCRNUR, LABBENZ, AMPHETMU, THCU, LABBARB  Alcohol Level No results found for:  [facility]   SIGNIFICANT DIAGNOSTIC STUDIES Ct Angio Head and neck W Or Wo Contrast 02/25/2017 IMPRESSION: 1. Right posterior M2 occlusion with marked attenuation of distal MCA branch vessels. 2. M1 segments are intact bilaterally. 3. Atherosclerotic calcifications at the cavernous internal carotid arteries bilaterally without significant stenoses. 4. Tortuosity of the cervical internal carotid arteries bilaterally without significant stenosis. 5. Mild degenerative changes of the cervical spine.   Mri and Mra Head Wo Contrast 02/26/2017 IMPRESSION: 1. No improvement in the posterior right MCA M2 branch occlusion and attenuation of other right MCA branches since the CTA yesterday. 2. However, a relatively small posterior right MCA territory core infarct is demonstrated, extending posteriorly in a patchy fashion from the posterior half of the insula. No associated hemorrhage or mass effect. 3. Only mild for age nonspecific white matter signal changes otherwise.   Ct Head Code Stroke Wo Contrast 02/25/2017 IMPRESSION: Possible dense/thrombosed right MCA. ASPECTS is 10. No acute hemorrhage.  TTE  - Left ventricle: The cavity size was normal. Systolic function was normal. The estimated ejection fraction was in the range of 60%to 65%. Wall motion was normal; there were no regional wallmotion abnormalities. Doppler parameters are consistent with abnormal left ventricular relaxation (grade 1 diastolic dysfunction). - Left atrium: The atrium was mildly dilated. Anterior-posterior dimension: 41 mm. Impressions: - No cardiac source of emboli was indentified.  Lower Extremity US (DVT) 02/28/2017 No evidence of deep vein or superficial thrombosis involving the right lower extremity and left lower extremity.  TEE 03/02/2017 Left Ventrical:  LVEF 60-65% Mitral Valve: No MR Aortic Valve: No AI Tricuspid Valve: Mild TR Pulmonic Valve: No PR Left Atrium/ Left atrial appendage: No thrombus, normal  emptying and filling velocities. Atrial septum: Hypermobile, lipomatous hypertrophy of the interatrial septum. No PFO, negative bubble study. 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 They agree. She will be referred to CV surgery and to me for a follow up.     HISTORY OF PRESENT ILLNESS Diksha Tagliaferro Ondickis an 76 y.o.femalewho presented acutely to the ED via EMS as a Code Stroke with sudden onset of left sided weakness, left facial droop, rightward gaze deviation and slurred speech. She woke up this AM in her USOH. Later in the morning while sitting down at a restaurant with her husband she became unresponsive and then,  when she started to speak again, the above symptoms were noted. Symptoms began at 0900. She has no prior history of stroke. She is not on an antiplatelet agent or a blood thinner. Her only home medications are Plendil and Tenormin.   LSN:0900 tPA Given:Yes, administered IV t-PA at 02/25/2017 at 1015  She was admitted to the neuro ICU for further evaluation and treatment.  HOSPITAL COURSE Ms. Leea Rambeau is a 76 y.o. female with history of HTN presenting with left sided weakness and right gaze, slurry speech. She received IV t-PA and symptoms much improved.   Stroke:  right MCA infarct, embolic, unclear source  Resultant  Left facial droop, left hand dexterity difficulty, left sensory neglect  CT head no acute abnormality  MRI head right MCA infarcts  MRA head right M2 persistent occlusion  CTA head and neck right M2 occlusion  2D Echo  EF 60-65%  LE venous doppler no DVT  TEE: EF 60-65%. Ascending aortic aneurysm.  CTA chest pending  LDL 115  HgbA1c 5.3  lovenox for VTE prophylaxis  Diet NPO time specified  Diet NPO time  specified Except for: Sips with Meds  Diet Heart Room service appropriate? Yes; Fluid consistency: Thin  No antithrombotic prior to admission, now on aspirin 325 mg daily.  Patient counseled to be compliant with her antithrombotic medications  Ongoing aggressive stroke risk factor management  Therapy recommendations: home OT/PT  Disposition: discharge to home/self-care  Hypertension  Stable  Permissive hypertension (OK if < 180/105) but gradually normalize in 5-7 days  Long-term BP goal normotensive  Hyperlipidemia  Home meds:  none  LDL 115, goal < 70  Add lipitor 20  Continue statin at discharge  Other Stroke Risk Factors  Advanced age  Other Active Problems  Hx of breast cancer - following with oncology, no recurrence  DISCHARGE EXAM Blood pressure (!) 159/102, pulse 72, temperature 98.4 F (36.9 C), temperature source Oral, resp. rate 18, height 5\' 9"  (1.753 m), weight 74.5 kg (164 lb 4.8 oz), SpO2 96 %.  General - Well nourished, well developed elderly caucasian lady not in distress,  Ophthalmologic - Sharp disc margins OU.  Skin -slight erythema and abrasion in the right forearm  at the site of tape removal  Cardiovascular - Regular rate and rhythm.  Mental Status -  Level of arousal and orientation to time, place, and person were intact. Language including expression, naming, repetition, comprehension was assessed and found intact. Fund of Knowledge was assessed and was intact.  Cranial Nerves II - XII - II - visual field full. III, IV, VI - Extraocular movements intact. V - Facial sensation intact bilaterally. VII - right nasolabial fold flattening. VIII - Hearing & vestibular intact bilaterally. X - Palate elevates symmetrically. XI - Chin turning & shoulder shrug intact bilaterally. XII - Tongue protrusion intact.  Motor Strength - The patient's strength was normal in all extremities  diminished fine finger movements on the left.  Orbits right over left upper extremity.. No pronator drift.  Bulk was normal and fasciculations were absent.  Mild weakness of left hip flexors and mild left upper extremity drift on simultaneous testing of both legs Motor Tone - Muscle tone was assessed at the neck and appendages and was normal.  Reflexes - The patient's reflexes were 1+ in all extremities and she had no pathological reflexes.  Sensory - Light touch, temperature/pinprick were assessed and were symmetrical.   Coordination - The patient had normal movements in the hands  with no ataxia or dysmetria.  Tremor was absent.  Gait and Station - deferred  Discharge Diet   Diet NPO time specified liquids  DISCHARGE PLAN  Disposition: Discharge to home/self-care  aspirin 325 mg daily for secondary stroke prevention.  Ongoing risk factor control by Primary Care Physician at time of discharge  Follow-up with Cardiothoracic Surgery to evaluate ascending aortic aneurysm, office to schedule appointment.  Follow-up Patient, No Pcp Per in 2 weeks.  Follow-up with Cecille Rubin, NP, Stroke Clinic in 6 weeks, office to schedule an appointment.  40 minutes were spent preparing discharge.  I have personally examined this patient, reviewed notes, independently viewed imaging studies, participated in medical decision making and plan of care.ROS completed by me personally and pertinent positives fully documented  I have made any additions or clarifications directly to the above note. Agree with note above.    Antony Contras, MD Medical Director [facility] Pager: [phone] 03/03/2017 5:10 PM

## 2017-03-02 NOTE — Progress Notes (Signed)
Occupational Therapy Treatment Patient Details Name: Zoe Andrade MRN: [id number] DOB: 1940/08/08 Today's Date: 03/02/2017    History of present illness 76 y.o. female who presented acutely to the ED via EMS as a Code Stroke with sudden onset of left sided weakness, left facial droop, rightward gaze deviation and slurred speech. baseline NIHSS of 17. tPA given. new baseline of NIHSS 3. MRI + No improvement in the posterior right MCA M2 branch occlusion and other MCA branches; R posterior MCA  infarct, extending posteriorly    OT comments  Pt progressing towards established OT goals. Pt performed toilet transfer and functional mobility with Min A and single point cane. Provided education/information on L sided inattention, 3N1 use, fall prevention, learned non-use, and BEFAST for stroke symptoms. Update dc recommendation to home with HHOT and 3N1. Answered all questions in preparation for dc home later today.    Follow Up Recommendations  Home health OT;Supervision/Assistance - 24 hour    Equipment Recommendations  3 in 1 bedside commode    Recommendations for Other Services Rehab consult    Precautions / Restrictions Precautions Precautions: Fall Precaution Comments: poor awareness, decreased attention to L side Restrictions Weight Bearing Restrictions: No       Mobility Bed Mobility               General bed mobility comments: Pt in transfer chair upon arrival  Transfers Overall transfer level: Needs assistance Equipment used: None Transfers: Sit to/from Stand Sit to Stand: Min guard Stand pivot transfers: Min assist       General transfer comment: increased time, close min guard for safety    Balance Overall balance assessment: Needs assistance Sitting-balance support: Feet supported Sitting balance-Leahy Scale: Good   Postural control: Posterior lean Standing balance support: During functional activity;No upper extremity supported Standing  balance-Leahy Scale: Fair Standing balance comment: Pt requiring Min A                           ADL either performed or assessed with clinical judgement   ADL Overall ADL's : Needs assistance/impaired                         Toilet Transfer: Ambulation;Minimal assistance;Cueing for sequencing;BSC (single point cane)   Toileting- Clothing Manipulation and Hygiene: Minimal assistance;Sit to/from stand       Functional mobility during ADLs: Minimal assistance;Cane;Moderate assistance General ADL Comments: Pt performing toilet transfer and fucntional mobility with Min A. Pt with tendency to walk close to L side (such as in a door way or by a wall). Discussed with pt and daughter safety information and learned non-use. Pt's daughter verbalized understanding     Vision       Perception     Praxis      Cognition Arousal/Alertness: Awake/alert Behavior During Therapy: WFL for tasks assessed/performed (tearful) Overall Cognitive Status: Impaired/Different from baseline Area of Impairment: Attention;Safety/judgement;Awareness;Problem solving;Following commands                   Current Attention Level: Sustained   Following Commands: Follows one step commands consistently;Follows multi-step commands inconsistently;Follows multi-step commands with increased time Safety/Judgement: Decreased awareness of safety;Decreased awareness of deficits Awareness: Emergent Problem Solving: Slow processing;Difficulty sequencing;Requires verbal cues General Comments: Pt alert and oriented x4        Exercises     Shoulder Instructions       General Comments Pt daughter  present throughout session    Pertinent Vitals/ Pain       Pain Assessment: Faces Faces Pain Scale: No hurt Pain Intervention(s): Monitored during session  Home Living                                          Prior Functioning/Environment              Frequency  Min  2X/week        Progress Toward Goals  OT Goals(current goals can now be found in the care plan section)  Progress towards OT goals: Progressing toward goals  Acute Rehab OT Goals Patient Stated Goal: home OT Goal Formulation: With patient/family Time For Goal Achievement: 03/12/17 Potential to Achieve Goals: Good ADL Goals Pt Will Perform Grooming: with modified independence;sitting Pt Will Perform Upper Body Bathing: with modified independence;sitting Pt Will Perform Lower Body Bathing: with supervision;sit to/from stand Pt Will Perform Upper Body Dressing: with modified independence;sitting Pt Will Perform Lower Body Dressing: with supervision;sit to/from stand Pt Will Transfer to Toilet: with supervision;ambulating  Plan Discharge plan needs to be updated;Frequency remains appropriate;Equipment recommendations need to be updated    Co-evaluation    PT/OT/SLP Co-Evaluation/Treatment: No            AM-PAC PT "6 Clicks" Daily Activity     Outcome Measure   Help from another person eating meals?: None Help from another person taking care of personal grooming?: A Little Help from another person toileting, which includes using toliet, bedpan, or urinal?: A Lot Help from another person bathing (including washing, rinsing, drying)?: A Lot Help from another person to put on and taking off regular upper body clothing?: A Little Help from another person to put on and taking off regular lower body clothing?: A Lot 6 Click Score: 16    End of Session Equipment Utilized During Treatment: Gait belt  OT Visit Diagnosis: Other abnormalities of gait and mobility (R26.89);Muscle weakness (generalized) (M62.81);Low vision, both eyes (H54.2);Other symptoms and signs involving cognitive function   Activity Tolerance Patient tolerated treatment well   Patient Left in chair;with call bell/phone within reach;with chair alarm set;with family/visitor present   Nurse Communication  Mobility status        Time: 8366-2947 OT Time Calculation (min): 33 min  Charges: OT General Charges $OT Visit: 1 Visit OT Treatments $Self Care/Home Management : 23-37 mins  [place], OTR/L Acute Rehab Pager: [phone] Office: [place] 03/02/2017, 5:29 PM

## 2017-03-02 NOTE — Care Management Note (Signed)
Case Management Note  Patient Details  Name: Zoe Andrade MRN: [id number] Date of Birth: 01-21-1941  Subjective/Objective:                    Action/Plan: Pt is discharging home with [place] orders. Pt plan to go to her daughters in [place] for a week and then transition home. CM provided the patient and her daughter a list of [facility] agencies. They chose [facility]. Cory with [facility] notified and accepted the referral, and they are able to see the patient in [place] and in [place]. Daughters address: [address] in [place] given to [place] along with daughters numbers: [phone] and [phone]. Pt with orders for 3 in1 and cane. Jermaine with [facility] notified and he will deliver the equipment to the room. Pt states she sees Dr Wendie Agreste in [place] as her PCP. Daughter to provide transportation home.  Expected Discharge Date:  03/02/17               Expected Discharge Plan:  [place]  In-House Referral:     Discharge planning Services  CM Consult  Post Acute Care Choice:  Durable Medical Equipment, Home Health Choice offered to:  Patient, Adult Children  DME Arranged:  3-N-1, Kasandra Knudsen DME Agency:  [place]:  RN, PT, OT, Nurse's Aide [place] Agency:  [facility]  Status of Service:  Completed, signed off  If discussed at [place] of Stay Meetings, dates discussed:    Additional Comments:  Pollie Friar, RN 03/02/2017, 4:49 PM

## 2017-03-02 NOTE — Progress Notes (Signed)
SLP Cancellation Note  Patient Details Name: Zoe Andrade MRN: [id number] DOB: 05-16-1941   Cancelled treatment:       Reason Eval/Treat Not Completed: Patient at procedure or test/unavailable   Kendra Grissett, Katherene Ponto 03/02/2017, 1:40 PM

## 2017-03-02 NOTE — CV Procedure (Addendum)
   Transesophageal Echocardiogram Note  Zoe Andrade [id number] 1941/03/29  Procedure: Transesophageal Echocardiogram Indications: Stroke  Procedure Details Consent: Obtained Time Out: Verified patient identification, verified procedure, site/side was marked, verified correct patient position, special equipment/implants available, Radiology Safety Procedures followed,  medications/allergies/relevent history reviewed, required imaging and test results available.  Performed  Medications: Fentanyl: 50 mcg  Versed: 4 mg  Left Ventrical:  LVEF 60-65%  Mitral Valve: No MR  Aortic Valve: No AI  Tricuspid Valve: Mild TR  Pulmonic Valve: No PR  Left Atrium/ Left atrial appendage: No thrombus, normal emptying and filling velocities.  Atrial septum: Hypermobile, lipomatous hypertrophy of the interatrial septum. No PFO, negative bubble study.  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 They agree. She will be referred to CV surgery and to me for a follow up.  Complications: No apparent complications Patient did tolerate procedure well.  Ena Dawley, MD, [facility] 03/02/2017, 4:31 PM

## 2017-03-02 NOTE — H&P (View-Only) (Signed)
STROKE TEAM PROGRESS NOTE   HISTORY OF PRESENT ILLNESS (per record) Zoe Andrade is an 76 y.o. female who presented acutely to the ED via EMS as a Code Stroke with sudden onset of left sided weakness, left facial droop, rightward gaze deviation and slurred speech. She woke up this AM in her USOH. Later in the morning while sitting down at a restaurant with her husband she became unresponsive and then, when she started to speak again, the above symptoms were noted. Symptoms began at 0900. She has no prior history of stroke. She is not on an antiplatelet agent or a blood thinner. Her only home medications are Plendil and Tenormin.    LSN: [id number] tPA Given: Yes, administered IV t-PA at 02/25/2017 at 1015       She was admitted to the neuro ICU for further evaluation and treatment.   SUBJECTIVE (INTERVAL HISTORY) Her husband and one daughter are at the bedside. She Feels she is doing better and has improved left-sided strength..  She is waiting for CIR placement. She complained of redness in the right antecubital region at the site of tape removal from an IV which had been placed by EMS.   OBJECTIVE Temp:  [98.1 F (36.7 C)-98.8 F (37.1 C)] 98.7 F (37.1 C) (09/16 0515) Pulse Rate:  [57-84] 58 (09/16 0515) Cardiac Rhythm: Normal sinus rhythm (09/16 0700) Resp:  [16-18] 18 (09/16 0515) BP: (137-159)/(85-100) 153/100 (09/16 0515) SpO2:  [97 %-98 %] 98 % (09/16 0515)  CBC:   Recent Labs Lab 02/25/17 0946  02/28/17 0246 03/01/17 0416  WBC 12.5*  < > 9.4 9.1  NEUTROABS 6.6  --   --   --   HGB 14.1  < > 13.3 13.1  HCT 42.1  < > 39.7 38.8  MCV 89.4  < > 90.0 90.0  PLT 185  < > 165 167  < > = values in this interval not displayed.  Basic Metabolic Panel:   Recent Labs Lab 02/28/17 0246 03/01/17 0416  NA 140 139  K 3.3* 4.0  CL 109 108  CO2 24 23  GLUCOSE 94 84  BUN 13 12  CREATININE 0.75 0.74  CALCIUM 9.0 9.1    Lipid Panel:     Component Value Date/Time   CHOL  186 02/26/2017 0208   TRIG 101 02/26/2017 0208   HDL 51 02/26/2017 0208   CHOLHDL 3.6 02/26/2017 0208   VLDL 20 02/26/2017 0208   LDLCALC 115 (H) 02/26/2017 0208   HgbA1c:  Lab Results  Component Value Date   HGBA1C 5.3 02/26/2017   Urine Drug Screen: No results found for: LABOPIA, COCAINSCRNUR, LABBENZ, AMPHETMU, THCU, LABBARB  Alcohol Level No results found for: [place] I have personally reviewed the radiological images below and agree with the radiology interpretations.  Ct Angio Head and neck W Or Wo Contrast 02/25/2017 IMPRESSION: 1. Right posterior M2 occlusion with marked attenuation of distal MCA branch vessels. 2. M1 segments are intact bilaterally. 3. Atherosclerotic calcifications at the cavernous internal carotid arteries bilaterally without significant stenoses. 4. Tortuosity of the cervical internal carotid arteries bilaterally without significant stenosis. 5. Mild degenerative changes of the cervical spine.   Mri and Mra Head Wo Contrast 02/26/2017 IMPRESSION: 1. No improvement in the posterior right MCA M2 branch occlusion and attenuation of other right MCA branches since the CTA yesterday. 2. However, a relatively small posterior right MCA territory core infarct is demonstrated, extending posteriorly in a patchy fashion from the posterior half  of the insula. No associated hemorrhage or mass effect. 3. Only mild for age nonspecific white matter signal changes otherwise.   Ct Head Code Stroke Wo Contrast 02/25/2017 IMPRESSION: Possible dense/thrombosed right MCA. ASPECTS is 10. No acute hemorrhage.    TTE  - Left ventricle: The cavity size was normal. Systolic function was   normal. The estimated ejection fraction was in the range of 60%   to 65%. Wall motion was normal; there were no regional wall   motion abnormalities. Doppler parameters are consistent with   abnormal left ventricular relaxation (grade 1 diastolic   dysfunction). - Left atrium: The atrium was  mildly dilated. Anterior-posterior   dimension: 41 mm. Impressions: - No cardiac source of emboli was indentified.  LE  No evidence of deep vein or superficial thrombosis involving the right lower extremity and left lower extremity.  TEE pending   PHYSICAL EXAM  Temp:  [98.1 F (36.7 C)-98.8 F (37.1 C)] 98.7 F (37.1 C) (09/16 0515) Pulse Rate:  [57-84] 58 (09/16 0515) Resp:  [16-18] 18 (09/16 0515) BP: (137-159)/(85-100) 153/100 (09/16 0515) SpO2:  [97 %-98 %] 98 % (09/16 0515)  General - Well nourished, well developed elderly caucasian lady not in distress,  Ophthalmologic - Sharp disc margins OU.  Skin -slight erythema and abrasion in the right forearm  at the site of tape removal  Cardiovascular - Regular rate and rhythm.  Mental Status -  Level of arousal and orientation to time, place, and person were intact. Language including expression, naming, repetition, comprehension was assessed and found intact. Fund of Knowledge was assessed and was intact.  Cranial Nerves II - XII - II - visual field full. III, IV, VI - Extraocular movements intact. V - Facial sensation intact bilaterally. VII - right nasolabial fold flattening. VIII - Hearing & vestibular intact bilaterally. X - Palate elevates symmetrically. XI - Chin turning & shoulder shrug intact bilaterally. XII - Tongue protrusion intact.  Motor Strength - The patient's strength was normal in all extremities  diminished fine finger movements on the left. Orbits right over left upper extremity.. No pronator drift.  Bulk was normal and fasciculations were absent.  Mild weakness of left hip flexors and mild left upper extremity drift on simultaneous testing of both legs Motor Tone - Muscle tone was assessed at the neck and appendages and was normal.  Reflexes - The patient's reflexes were 1+ in all extremities and she had no pathological reflexes.  Sensory - Light touch, temperature/pinprick were assessed and were  symmetrical.   Coordination - The patient had normal movements in the hands with no ataxia or dysmetria.  Tremor was absent.  Gait and Station - deferred   ASSESSMENT/PLAN [name] is a 76 y.o. female with history of HTN presenting with left sided weakness and right gaze, slurry speech. She received IV t-PA and symptoms much improved.   Stroke:  right MCA infarct, embolic, unclear source  Resultant  Left facial droop, left hand dexterity difficulty, left sensory neglect  CT head no acute abnormality  MRI head right MCA infarcts  MRA head right M2 persistent occlusion  CTA head and neck right M2 occlusion  2D Echo  EF 60-65%  LE venous doppler no DVT Recommend TEE and loop recorder    LDL 115  HgbA1c 5.3  lovenox for VTE prophylaxis Diet NPO time specified Diet NPO time specified Except for: Sips with Meds Diet Heart Room service appropriate? Yes; Fluid consistency: Thin  No antithrombotic  prior to admission, now on aspirin 325 mg daily.  Patient counseled to be compliant with her antithrombotic medications  Ongoing aggressive stroke risk factor management  Therapy recommendations:  CIR  Disposition:  pending  Hypertension  Stable Permissive hypertension (OK if < 180/105) but gradually normalize in 5-7 days Long-term BP goal normotensive  Hyperlipidemia  Home meds:  none  LDL 115, goal < 70  Add lipitor 20  Continue statin at discharge  Other Stroke Risk Factors  Advanced age  Other Active Problems  Hx of breast cancer - following with oncology, no recurrence  Hospital day # 4 I have personally examined this patient, reviewed notes, independently viewed imaging studies, participated in medical decision making and plan of care.ROS completed by me personally and pertinent positives fully documented  I have made any additions or clarifications directly to the above note.  Long discussion with the patient, husband and daughter regarding  her pending workup of TEE and loop recorder and likely discharge to inpatient rehabilitation over the next few days. If she continues to improve she may return go home with therapy. Greater than 50% time during this 25 minute visit was spent on counseling and coordination of category embolic stroke and discussion about evaluation, prevention and treatment Antony Contras, MD Medical Director [facility] Pager: [phone] 03/01/2017 12:00 PM   To contact Stroke Continuity provider, please refer to [email or web address] After hours, contact General Neurology

## 2017-03-02 NOTE — Progress Notes (Signed)
OT Cancellations    03/02/17 1336  OT Visit Information  Last OT Received On 03/02/17  Reason Eval/Treat Not Completed Patient at procedure or test/ unavailable (Off the floor for echo. Will return as schedule allows)  History of Present Illness 76 y.o. female who presented acutely to the ED via EMS as a Code Stroke with sudden onset of left sided weakness, left facial droop, rightward gaze deviation and slurred speech. baseline NIHSS of 17. tPA given. new baseline of NIHSS 3. MRI + No improvement in the posterior right MCA M2 branch occlusion and other MCA branches; R posterior MCA  infarct, extending posteriorly     Santino Kinsella MSOT, OTR/L Acute Rehab Pager: [phone] Office: [phone]

## 2017-03-02 NOTE — Interval H&P Note (Signed)
History and Physical Interval Note:  03/02/2017 1:23 PM  Zoe Andrade  has presented today for surgery, with the diagnosis of stroke  The various methods of treatment have been discussed with the patient and family. After consideration of risks, benefits and other options for treatment, the patient has consented to  Procedure(s): TRANSESOPHAGEAL ECHOCARDIOGRAM (TEE) (N/A) as a surgical intervention .  The patient's history has been reviewed, patient examined, no change in status, stable for surgery.  I have reviewed the patient's chart and labs.  Questions were answered to the patient's satisfaction.     Ena Dawley
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
## 2017-03-02 NOTE — Progress Notes (Signed)
Inpatient Rehabilitation  I have received a denial from [facility] for [place] admission.  Requested Dr. Posey Pronto review case and he stated there was nothing to warrant an appeal.  I have notified patient, daughter, CSW, and nurse case Freight forwarder.  Will sign off at this time.  Please call with questions.   Carmelia Roller., CCC/SLP Admission Coordinator  [facility]  Cell [phone]

## 2017-03-03 ENCOUNTER — Other Ambulatory Visit: Payer: Self-pay | Admitting: Neurology

## 2017-03-03 ENCOUNTER — Telehealth: Payer: Self-pay | Admitting: Neurology

## 2017-03-03 DIAGNOSIS — I480 Paroxysmal atrial fibrillation: Secondary | ICD-10-CM

## 2017-03-03 NOTE — Telephone Encounter (Signed)
Thank you :)

## 2017-03-03 NOTE — Telephone Encounter (Signed)
Spoke to the patient's daughter Olin Hauser who informed me that the patient was seen in [facility] for palpitations and sweating and was diagnosed with new onset A. fib. She was started on eliquis and aspirin. I recommend that the patient discontinue aspirin and stay on eliquis alone. I have placed her consultation order in Epic for her to see the cardiologist in [place] to manage her A. fib. She also has an appointment to see cardiothoracic surgeon to discuss a ascending aortic aneurysm. She voiced understanding. The patient does not need any outpatient cardiac monitor since A. fib has been diagnosed

## 2017-03-03 NOTE — Telephone Encounter (Signed)
Rn call Dr.Sethi about pts daughter wanted to know why heart monitor was not given before discharge. Message sent to Dr Leonie Man.
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
## 2017-03-04 ENCOUNTER — Other Ambulatory Visit: Payer: Self-pay | Admitting: *Deleted

## 2017-03-16 ENCOUNTER — Telehealth: Payer: Self-pay | Admitting: *Deleted

## 2017-03-17 ENCOUNTER — Encounter: Payer: Self-pay | Admitting: Cardiology

## 2017-03-17 ENCOUNTER — Encounter (INDEPENDENT_AMBULATORY_CARE_PROVIDER_SITE_OTHER): Payer: Self-pay

## 2017-03-17 ENCOUNTER — Ambulatory Visit (INDEPENDENT_AMBULATORY_CARE_PROVIDER_SITE_OTHER): Payer: Medicare HMO | Admitting: Cardiology

## 2017-03-17 VITALS — BP 126/78 | HR 70 | Ht 64.0 in | Wt 155.6 lb

## 2017-03-17 DIAGNOSIS — I480 Paroxysmal atrial fibrillation: Secondary | ICD-10-CM | POA: Diagnosis not present

## 2017-03-17 DIAGNOSIS — I712 Thoracic aortic aneurysm, without rupture: Secondary | ICD-10-CM | POA: Diagnosis not present

## 2017-03-17 DIAGNOSIS — I700 Atherosclerosis of aorta: Secondary | ICD-10-CM

## 2017-03-17 DIAGNOSIS — I6389 Other cerebral infarction: Secondary | ICD-10-CM

## 2017-03-17 DIAGNOSIS — I7121 Aneurysm of the ascending aorta, without rupture: Secondary | ICD-10-CM

## 2017-03-17 MED ORDER — APIXABAN 5 MG PO TABS: 5.0000 mg | ORAL_TABLET | Freq: Two times a day (BID) | ORAL | 11 refills | Status: DC

## 2017-03-17 NOTE — Patient Instructions (Signed)
Medication Instructions:  The current medical regimen is effective;  continue present plan and medications.  Follow-Up: Follow up in 4 months with NP/PA.  Follow up in 1 year with Dr. Marlou Porch.  You will receive a letter in the mail 2 months before you are due.  Please call us when you receive this letter to schedule your follow up appointment.  If you need a refill on your cardiac medications before your next appointment, please call your pharmacy.  Thank you for choosing [facility]!!     Heart-Healthy Eating Plan Many factors influence your heart health, including eating and exercise habits. Heart (coronary) risk increases with abnormal blood fat (lipid) levels. Heart-healthy meal planning includes limiting unhealthy fats, increasing healthy fats, and making other small dietary changes. This includes maintaining a healthy body weight to help keep lipid levels within a normal range. What is my plan? Your health care provider recommends that you:  Get no more than _________% of the total calories in your daily diet from fat.  Limit your intake of saturated fat to less than _________% of your total calories each day.  Limit the amount of cholesterol in your diet to less than _________ mg per day.  What types of fat should I choose?  Choose healthy fats more often. Choose monounsaturated and polyunsaturated fats, such as olive oil and canola oil, flaxseeds, walnuts, almonds, and seeds.  Eat more omega-3 fats. Good choices include salmon, mackerel, sardines, tuna, flaxseed oil, and ground flaxseeds. Aim to eat fish at least two times each week.  Limit saturated fats. Saturated fats are primarily found in animal products, such as meats, butter, and cream. Plant sources of saturated fats include palm oil, palm kernel oil, and coconut oil.  Avoid foods with partially hydrogenated oils in them. These contain trans fats. Examples of foods that contain trans fats are stick  margarine, some tub margarines, cookies, crackers, and other baked goods. What general guidelines do I need to follow?  Check food labels carefully to identify foods with trans fats or high amounts of saturated fat.  Fill one half of your plate with vegetables and green salads. Eat 4-5 servings of vegetables per day. A serving of vegetables equals 1 cup of raw leafy vegetables,  cup of raw or cooked cut-up vegetables, or  cup of vegetable juice.  Fill one fourth of your plate with whole grains. Look for the word "whole" as the first word in the ingredient list.  Fill one fourth of your plate with lean protein foods.  Eat 4-5 servings of fruit per day. A serving of fruit equals one medium whole fruit,  cup of dried fruit,  cup of fresh, frozen, or canned fruit, or  cup of 100% fruit juice.  Eat more foods that contain soluble fiber. Examples of foods that contain this type of fiber are apples, broccoli, carrots, beans, peas, and barley. Aim to get 20-30 g of fiber per day.  Eat more home-cooked food and less restaurant, buffet, and fast food.  Limit or avoid alcohol.  Limit foods that are high in starch and sugar.  Avoid fried foods.  Cook foods by using methods other than frying. Baking, boiling, grilling, and broiling are all great options. Other fat-reducing suggestions include: ? Removing the skin from poultry. ? Removing all visible fats from meats. ? Skimming the fat off of stews, soups, and gravies before serving them. ? Steaming vegetables in water or broth.  Lose weight if you are overweight. Losing  just 5-10% of your initial body weight can help your overall health and prevent diseases such as diabetes and heart disease.  Increase your consumption of nuts, legumes, and seeds to 4-5 servings per week. One serving of dried beans or legumes equals  cup after being cooked, one serving of nuts equals 1 ounces, and one serving of seeds equals  ounce or 1 tablespoon.  You  may need to monitor your salt (sodium) intake, especially if you have high blood pressure. Talk with your health care provider or dietitian to get more information about reducing sodium. What foods can I eat? Grains  Breads, including [place], white, pita, wheat, raisin, rye, oatmeal, and [place]. Tortillas that are neither fried nor made with lard or trans fat. Low-fat rolls, including hotdog and hamburger buns and English muffins. Biscuits. Muffins. Waffles. Pancakes. Light popcorn. Whole-grain cereals. Flatbread. Melba toast. Pretzels. Breadsticks. Rusks. Low-fat snacks and crackers, including oyster, saltine, matzo, graham, animal, and rye. Rice and pasta, including brown rice and those that are made with whole wheat. Vegetables All vegetables. Fruits All fruits, but limit coconut. Meats and Other Protein Sources Lean, well-trimmed beef, veal, pork, and lamb. Chicken and [place] without skin. All fish and shellfish. Wild duck, rabbit, pheasant, and venison. Egg whites or low-cholesterol egg substitutes. Dried beans, peas, lentils, and tofu.Seeds and most nuts. Dairy Low-fat or nonfat cheeses, including ricotta, string, and mozzarella. Skim or 1% milk that is liquid, powdered, or evaporated. Buttermilk that is made with low-fat milk. Nonfat or low-fat yogurt. Beverages Mineral water. Diet carbonated beverages. Sweets and Desserts Sherbets and fruit ices. Honey, jam, marmalade, jelly, and syrups. Meringues and gelatins. Pure sugar candy, such as hard candy, jelly beans, gumdrops, mints, marshmallows, and small amounts of dark chocolate. [employer] Eat all sweets and desserts in moderation. Fats and Oils Nonhydrogenated (trans-free) margarines. Vegetable oils, including soybean, sesame, sunflower, olive, peanut, safflower, corn, canola, and cottonseed. Salad dressings or mayonnaise that are made with a vegetable oil. Limit added fats and oils that you use for cooking, baking, salads, and as  spreads. Other Cocoa powder. Coffee and tea. All seasonings and condiments. The items listed above may not be a complete list of recommended foods or beverages. Contact your dietitian for more options. What foods are not recommended? Grains Breads that are made with saturated or trans fats, oils, or whole milk. Croissants. Butter rolls. Cheese breads. Sweet rolls. Donuts. Buttered popcorn. Chow mein noodles. High-fat crackers, such as cheese or butter crackers. Meats and Other Protein Sources Fatty meats, such as hotdogs, short ribs, sausage, spareribs, bacon, ribeye roast or steak, and mutton. High-fat deli meats, such as salami and bologna. Caviar. Domestic duck and goose. Organ meats, such as kidney, liver, sweetbreads, brains, gizzard, chitterlings, and heart. Dairy Cream, sour cream, cream cheese, and creamed cottage cheese. Whole milk cheeses, including blue (bleu), Monterey Jack, [place], [place], American, [place], Swiss, [place], [place], and [place]. Whole or 2% milk that is liquid, evaporated, or condensed. Whole buttermilk. Cream sauce or high-fat cheese sauce. Yogurt that is made from whole milk. Beverages Regular sodas and drinks with added sugar. Sweets and Desserts Frosting. Pudding. Cookies. Cakes other than angel food cake. Candy that has milk chocolate or white chocolate, hydrogenated fat, butter, coconut, or unknown ingredients. Buttered syrups. Full-fat ice cream or ice cream drinks. Fats and Oils Gravy that has suet, meat fat, or shortening. Cocoa butter, hydrogenated oils, palm oil, coconut oil, palm kernel oil. These can often be found in baked products,  candy, fried foods, nondairy creamers, and whipped toppings. Solid fats and shortenings, including bacon fat, salt pork, lard, and butter. Nondairy cream substitutes, such as coffee creamers and sour cream substitutes. Salad dressings that are made of unknown oils, cheese, or sour cream. The items listed above may not be a  complete list of foods and beverages to avoid. Contact your dietitian for more information. This information is not intended to replace advice given to you by your health care provider. Make sure you discuss any questions you have with your health care provider. Document Released: 03/11/2008 Document Revised: 12/21/2015 Document Reviewed: 11/24/2013 Elsevier Interactive Patient Education  2017 [employer].

## 2017-03-17 NOTE — Progress Notes (Signed)
Cardiology Office Note:    Date:  03/17/2017   ID:  Zoe Andrade, DOB 27-Jul-1940, MRN [id number]  PCP:  Enid Skeens., MD  Cardiologist:  Candee Furbish, MD    Referring MD: Garvin Fila, MD     History of Present Illness:    Zoe Andrade is a 76 y.o. female here for evaluation of paroxysmal atrial fibrillation at the request of Dr. Leonie Man.  She was seen at [facility] for palpitations and sweating and was diagnosed with new onset atrial fibrillation. She was started on both Eliquis and aspirin. It was recommended that she discontinue the aspirin and stay on Eliquis alone.  She did not feel her atrial fibrillation when it was detected. Asymptomatic. Has not had any bleeding since starting Eliquis. Her heart rate was 70 in atrial fibrillation.  Denies any chest pain, syncope, fevers. Her strokelike symptoms are improving. Physical therapy at home.  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 There was also an incidental finding of potential meningioma at T9.  She is here today with her husband and daughter. They're originally from [place].  She snores. May have sleep apnea.   Past Medical History:  Diagnosis Date  . Cancer ([place])   . Hypertension     Past Surgical History:  Procedure Laterality Date  . MASTECTOMY Right   . TEE WITHOUT CARDIOVERSION N/A 03/02/2017   Procedure: TRANSESOPHAGEAL ECHOCARDIOGRAM (TEE);  Surgeon: Dorothy Spark, MD;  Location: [facility] ENDOSCOPY;  Service: Cardiovascular;  Laterality: N/A;    Current Medications: Current Meds  Medication Sig  . apixaban (ELIQUIS) 5 MG TABS tablet Take 5 mg by mouth 2 (two) times daily.  Marland Kitchen atenolol (TENORMIN) 25 MG tablet Take 0.5 tablets (12.5 mg total) by mouth daily.  Marland Kitchen atorvastatin (LIPITOR) 20 MG tablet Take 1 tablet (20 mg total) by mouth daily at 6 PM.  . felodipine (PLENDIL) 10 MG 24 hr tablet Take 10 mg by mouth daily.  . [DISCONTINUED] aspirin EC 325 MG EC  tablet Take 1 tablet (325 mg total) by mouth daily.  . [DISCONTINUED] neomycin-bacitracin-polymyxin (NEOSPORIN) ointment Apply topically 3 (three) times daily. apply to eye     Allergies:   Patient has no known allergies.   Social History   Social History  . Marital status: Married    Spouse name: N/A  . Number of children: N/A  . Years of education: N/A   Social History Main Topics  . Smoking status: Never Smoker  . Smokeless tobacco: Never Used  . Alcohol use No     Comment: uta  . Drug use: No  . Sexual activity: Not Asked   Other Topics Concern  . None   Social History Narrative  . None     Family History: The patient's  Brother died of MI, Father had CVA and MI  ROS:   Please see the history of present illness.    Minimal weakness from stroke noted. Clear speech. No chest pain, no shortness of breath, no palpitations All other systems reviewed and are negative.  EKGs/Labs/Other Studies Reviewed:    The following studies were reviewed today:  TTE  - Left ventricle: The cavity size was normal. Systolic function was normal. The estimated ejection fraction was in the range of 60%to 65%. Wall motion was normal; there were no regional wallmotion abnormalities. Doppler parameters are consistent with abnormal left ventricular relaxation (grade 1 diastolic dysfunction). - Left atrium: The atrium was mildly  dilated. Anterior-posterior dimension: 41 mm. Impressions: - No cardiac source of emboli was indentified.  Lower Extremity US (DVT) 02/28/2017 No evidence of deep vein or superficial thrombosis involving the right lower extremity and left lower extremity.  TEE 03/02/2017 Left Ventrical:LVEF 60-65% Mitral Valve:No MR Aortic Valve:No AI Tricuspid Valve: Mild TR Pulmonic Valve: No PR Left Atrium/ Left atrial appendage: No thrombus, normal emptying and filling velocities. Atrial septum:Hypermobile, lipomatous hypertrophy of the interatrial septum. No  PFO, negative bubble study. Aorta:Severe non-mobile atheroma in the descending thoracic aorta. Ascending aorta is dilated measuring 42-43 mm, a dissection can't be excluded on current images, a dedicated chest CTA will be ordered, this was discussed with Dr Leonie Man, the patient and her daughter. They agree. She will be referred to CV surgery and to me for a follow up.   CT angio of chest: 1. Aneurysmal dilatation of the ascending thoracic aorta, measuring up to 4.2 cm. No evidence of dissection. Recommend annual imaging followup by CTA or MRA. This recommendation follows 2010 ACCF/AHA/AATS/ACR/ASA/SCA/SCAI/SIR/STS/SVM Guidelines for the Diagnosis and Management of Patients with Thoracic Aortic Disease. Circulation. 2010; 121: D983-J825 2. Round 8 mm calcified lesion in the right aspect of the spinal canal at the level of T8, possibly a meningioma. Consider further evaluation with nonemergent/outpatient contrast-enhanced MRI of the thoracic spine.  EKG:  EKG from 03/03/17 demonstrated atrial fibrillation heart rate of 70. Subsequent EKG showed normal sinus rhythm.  Recent Labs: 02/25/2017: ALT 21 03/02/2017: BUN 11; Creatinine, Ser 0.71; Hemoglobin 13.0; Platelets 164; Potassium 3.7; Sodium 138  Recent Lipid Panel    Component Value Date/Time   CHOL 186 02/26/2017 0208   TRIG 101 02/26/2017 0208   HDL 51 02/26/2017 0208   CHOLHDL 3.6 02/26/2017 0208   VLDL 20 02/26/2017 0208   LDLCALC 115 (H) 02/26/2017 0208    Physical Exam:    VS:  BP 126/78   Pulse 70   Ht 5\' 4"  (1.626 m)   Wt 155 lb 9.6 oz (70.6 kg)   LMP  (LMP Unknown)   BMI 26.71 kg/m     Wt Readings from Last 3 Encounters:  03/17/17 155 lb 9.6 oz (70.6 kg)  02/26/17 164 lb 4.8 oz (74.5 kg)     GEN:  Well nourished, well developed in no acute distress, using cane.  HEENT: Normal NECK: No JVD; No carotid bruits LYMPHATICS: No lymphadenopathy CARDIAC: RRR, no murmurs, rubs, gallops RESPIRATORY:  Clear to auscultation  without rales, wheezing or rhonchi  ABDOMEN: Soft, non-tender, non-distended MUSCULOSKELETAL:  No edema; No deformity  SKIN: Warm and dry NEUROLOGIC:  Alert and oriented x 3 PSYCHIATRIC:  Normal affect   ASSESSMENT:    1. PAF (paroxysmal atrial fibrillation) ([place])   2. Cerebrovascular accident (CVA) due to other mechanism   3. Aortic atherosclerosis ([place])   4. Ascending aortic aneurysm (H[facility])    PLAN:    In order of problems listed above:  Paroxysmal atrial fibrillation -Asymptomatic, does not feel any palpitations. This was picked up at [facility], C EKG report from care everywhere on 03/03/17. Heart rate was well controlled with 70 bpm. -Continue with Eliquis 5 mg twice a day. Prior stroke. Low-dose atenolol. Normal EF. Consider sleep study giving her snoring.  Aortic atherosclerosis -Continue with statin therapy, blood pressure control, whole foods, heart healthy diet. Exercise. Plaque noted.  Ascending aortic aneurysm/dilatation -42-43 mm. In one year, repeat CT scan of aorta. I personally reviewed echocardiogram and the ascending root is not well visualized on transthoracic echocardiogram.  Stroke -Secondary prevention. Eliquis.  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 Consider MRI. See report as above. I will forward to Dr. Wendie Agreste, Jenny Reichmann  We will see her back in 4 months. Continue to monitor CBC and basic metabolic profile every 6 months.  Medication Adjustments/Labs and Tests Ordered: Current medicines are reviewed at length with the patient today.  Concerns regarding medicines are outlined above.  No orders of the defined types were placed in this encounter.  No orders of the defined types were placed in this encounter.   Signed, Candee Furbish, MD  03/17/2017 12:54 PM    [facility]

## 2017-03-17 NOTE — Addendum Note (Signed)
Addended by: Shellia Cleverly on: 03/17/2017 01:08 PM   Modules accepted: Orders

## 2017-03-18 ENCOUNTER — Encounter: Payer: Medicare HMO | Admitting: Cardiothoracic Surgery

## 2017-03-24 ENCOUNTER — Telehealth: Payer: Self-pay | Admitting: Cardiology

## 2017-03-24 NOTE — Telephone Encounter (Signed)
New message     Pt is taking vitamins Calcium and Vitamin D, will these interfere with any of the medications you have her on? She forgot to mention this in her appointment.

## 2017-03-24 NOTE — Telephone Encounter (Signed)
Pt aware OK to take Vit D and Calcium with the medications she has been prescribed.

## 2017-03-25 ENCOUNTER — Other Ambulatory Visit: Payer: Self-pay | Admitting: Cardiology

## 2017-03-25 MED ORDER — APIXABAN 5 MG PO TABS: 5.0000 mg | ORAL_TABLET | Freq: Two times a day (BID) | ORAL | 3 refills | Status: DC

## 2017-03-25 NOTE — Telephone Encounter (Signed)
Eliquis refill; pt is 76 yrs old, wt-70.6kg, Crea-0.71 on 03/02/17, last seen by Dr. Marlou Porch on 03/17/17. Pt is on the correct dose & refill was has been sent to requested Pharmacy.

## 2017-03-25 NOTE — Telephone Encounter (Signed)
New message    Pt is calling about setting up having her medications mailed through [facility].     *STAT* If patient is at the pharmacy, call can be transferred to refill team.   1. Which medications need to be refilled? (please list name of each medication and dose if known) eliquis 5 mg  2. Which pharmacy/location (including street and city if local pharmacy) is medication to be sent to? Pharmacy through [facility]  3. Do they need a 30 day or 90 day supply? 90 day

## 2017-03-31 ENCOUNTER — Telehealth: Payer: Self-pay | Admitting: Cardiology

## 2017-03-31 NOTE — Telephone Encounter (Signed)
New message    Pt daughter is calling asking for a call back. She said that when pt was here to see Dr. Marlou Porch he mentioned having PCP look over CT scan. She said they don't know what she is talking about and she would like to speak with the nurse about how to clear this up. Please call.

## 2017-03-31 NOTE — Telephone Encounter (Signed)
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 Nurse advised daughter that records were sent with this information and that the last office notes and the CTA chest report will be re-sent to PCP's office.

## 2017-04-20 NOTE — Progress Notes (Signed)
[facility]  PATIENT: Zoe Andrade DOB: 05/13/1941   REASON FOR VISIT: Hospital follow-up for stroke HISTORY FROM: Patient husband  and daughter [place] RECORDAnna Lacretia Andrade an 76 y.o.femalewho presented acutely to the ED via EMS as a Code Stroke with sudden onset of left sided weakness, left facial droop, rightward gaze deviation and slurred speech. She woke up this AM in her USOH. Later in the morning while sitting down at a restaurant with her husband she became unresponsive and then, when she started to speak again, the above symptoms were noted. Symptoms began at 0900. She has no prior history of stroke. She is not on an antiplatelet agent or a blood thinner. Her only home medications are Plendil and Tenormin.  LSN:0900 tPA Given:Yes, administered IV t-PA at 02/25/2017 at 1015 She was admitted to the neuro ICU for further evaluation and treatment. SUBJECTIVE (INTERVAL HISTORY) Her husband and one daughter are at the bedside. She Feels she is doing better and has improved left-sided strength..  She is waiting for CIR placement. She complained of redness in the right antecubital region at the site of tape removal from an IV which had been placed by EMS. Interval history 11/6/2018[name], 76 year old female returns for follow-up after being admitted to the hospital for stroke event  September 12th 2018.  MRI of the brain right MCA infarct MRA of the head  right M2 persistent occlusion CTA head and neck right M 2 occlusion.  2D echo 60-65 % EF.  Bubble study was negative LDL 115.  Hemoglobin A1c 5.3.  She was discharged on 03/02/17  and went to [place] with her daughter.  On 02/1817 she was admitted to [facility] new diagnosis of atrial fib and she was placed on Eliquis 5 mg twice daily.  She did not feel any palpitations and did not know her heart rate was irregular.  Her home physical therapy and occupational therapy has  just concluded last week.  She remains on Eliquis without further stroke or TIA symptoms.  She has minimal bruising and no signs of bleeding.  Blood pressure in the office today 131/87.  She is on Lipitor for hyperlipidemia, she denies myalgias.  She is followed by Dr. Luther Parody cardiology.  She was referred to cardiothoracic surgery to discuss her ascending aortic aneurysm.  She is trying to exercise diet.  Patient claims she snores but denies any daytime drowsiness.  Husband is not aware of any periods of apnea during sleep, she returns for reevaluation    REVIEW OF SYSTEMS: Full 14 system review of systems performed and notable only for those listed, all others are neg:  Constitutional: neg  Cardiovascular: neg Ear/Nose/Throat: neg  Skin: neg Eyes: neg Respiratory: neg Gastroitestinal: neg  Hematology/Lymphatic: neg  Endocrine: neg Musculoskeletal:neg Allergy/Immunology: neg Neurological: neg Psychiatric: neg Sleep : snoring   ALLERGIES: No Known Allergies  HOME MEDICATIONS: Outpatient Medications Prior to Visit  Medication Sig Dispense Refill  . apixaban (ELIQUIS) 5 MG TABS tablet Take 1 tablet (5 mg total) by mouth 2 (two) times daily. 180 tablet 3  . atenolol (TENORMIN) 25 MG tablet Take 0.5 tablets (12.5 mg total) by mouth daily. 15 tablet 0  . atorvastatin (LIPITOR) 20 MG tablet Take 1 tablet (20 mg total) by mouth daily at 6 PM. 30 tablet 0  . felodipine (PLENDIL) 10 MG 24 hr tablet Take 10 mg by mouth daily.     No facility-administered medications prior to visit.  PAST MEDICAL HISTORY: Past Medical History:  Diagnosis Date  . Cancer ([place])   . Hypertension     PAST SURGICAL HISTORY: Past Surgical History:  Procedure Laterality Date  . MASTECTOMY Right     FAMILY HISTORY: History reviewed. No pertinent family history.  SOCIAL HISTORY: Social History   Socioeconomic History  . Marital status: Married    Spouse name: Not on file  . Number of children:  Not on file  . Years of education: Not on file  . Highest education level: Not on file  Social Needs  . Financial resource strain: Not on file  . Food insecurity - worry: Not on file  . Food insecurity - inability: Not on file  . Transportation needs - medical: Not on file  . Transportation needs - non-medical: Not on file  Occupational History  . Not on file  Tobacco Use  . Smoking status: Never Smoker  . Smokeless tobacco: Never Used  Substance and Sexual Activity  . Alcohol use: No    Comment: uta  . Drug use: No  . Sexual activity: Not on file  Other Topics Concern  . Not on file  Social History Narrative  . Not on file     PHYSICAL EXAM  Vitals:   04/21/17 0733  BP: 131/87  Pulse: 63  Weight: 155 lb (70.3 kg)  Height: 5\' 4"  (1.626 m)   Body mass index is 26.61 kg/m.  Generalized: Well developed, in no acute distress  Head: normocephalic and atraumatic,. Oropharynx benign  Neck: Supple, no carotid bruits  Cardiac: Regular rate rhythm, no murmur  Musculoskeletal: No deformity   Neurological examination   Mentation: Alert oriented to time, place, history taking. Attention span and concentration appropriate. Recent and remote memory intact.  Follows all commands speech and language fluent.   Cranial nerve II-XII: Fundoscopic exam reveals sharp disc margins.Pupils were equal round reactive to light extraocular movements were full, visual field were full on confrontational test. Facial sensation and strength were normal. hearing was intact to finger rubbing bilaterally. Uvula tongue midline. head turning and shoulder shrug were normal and symmetric.Tongue protrusion into cheek strength was normal. Motor: normal bulk and tone, full strength in the BUE, BLE, Sensory: normal and symmetric to light touch, pinprick, and  Vibration, in the upper and lower extremities  Coordination: finger-nose-finger, heel-to-shin bilaterally, no dysmetria, no tremor Reflexes: 1+ upper  lower and symmetric, plantar responses were flexor bilaterally. Gait and Station: Rising up from seated position without assistance, normal stance,  moderate stride, good arm swing, smooth turning, able to perform tiptoe, and heel walking without difficulty. Tandem gait is steady.  No assistive device  DIAGNOSTIC DATA (LABS, IMAGING, TESTING) - I reviewed patient records, labs, notes, testing and imaging myself where available.  Lab Results  Component Value Date   WBC 8.1 03/02/2017   HGB 13.0 03/02/2017   HCT 38.2 03/02/2017   MCV 89.3 03/02/2017   PLT 164 03/02/2017      Component Value Date/Time   NA 138 03/02/2017 0144   K 3.7 03/02/2017 0144   CL 109 03/02/2017 0144   CO2 25 03/02/2017 0144   GLUCOSE 107 (H) 03/02/2017 0144   BUN 11 03/02/2017 0144   CREATININE 0.71 03/02/2017 0144   CALCIUM 9.2 03/02/2017 0144   PROT 7.0 02/25/2017 0946   ALBUMIN 4.0 02/25/2017 0946   AST 23 02/25/2017 0946   ALT 21 02/25/2017 0946   ALKPHOS 87 02/25/2017 0946   BILITOT 0.7 02/25/2017 0946  GFRNONAA >60 03/02/2017 0144   GFRAA >60 03/02/2017 0144   Lab Results  Component Value Date   CHOL 186 02/26/2017   HDL 51 02/26/2017   LDLCALC 115 (H) 02/26/2017   TRIG 101 02/26/2017   CHOLHDL 3.6 02/26/2017   Lab Results  Component Value Date   HGBA1C 5.3 02/26/2017   ASSESSMENT AND PLAN Zoe Andrade is a 76 y.o. female with history of HTN presenting with left sided weakness and right gaze, slurry speech. She received IV t-PA and symptoms much improved.  She was discharged on 03/02/2017 and went home with her daughter in [place].  She was readmitted to [facility] on 918 new diagnosis of atrial fibrillation and placed on Eliquis 5 mg twice daily.   Stressed the importance of management of risk factors to prevent further stroke Continue Eliquis for secondary stroke prevention and atrial fibrillation Maintain strict control of hypertension with blood pressure goal below 130/90,  today's reading 131/87 continue antihypertensive medications Control of diabetes with hemoglobin A1c below 6.5 followed by primary care most recent hemoglobin A1c 5.3 Cholesterol with LDL cholesterol less than 70, followed by primary care,  most recent 115 continue statin drug Lipitor Exercise by walking, recommend 30 minutes a day  eat healthy diet with whole grains,  fresh fruits and vegetables lean meats chicken and fish Continue follow-up with cardiology Follow-up in 6 months Discussed risk for recurrent stroke/ TIA and answered additional questions This was a visit requiring 30 minutes and medical decision making of high complexity with extensive review of history, hospital chart, counseling and answering questions for patient and the daughter Zoe Andrade, [facility], [facility], [facility] [address], [place], [place] [address] [phone]

## 2017-04-21 ENCOUNTER — Encounter: Payer: Self-pay | Admitting: Nurse Practitioner

## 2017-04-21 ENCOUNTER — Ambulatory Visit: Payer: Medicare HMO | Admitting: Nurse Practitioner

## 2017-04-21 VITALS — BP 131/87 | HR 63 | Ht 64.0 in | Wt 155.0 lb

## 2017-04-21 DIAGNOSIS — I10 Essential (primary) hypertension: Secondary | ICD-10-CM | POA: Diagnosis not present

## 2017-04-21 DIAGNOSIS — I480 Paroxysmal atrial fibrillation: Secondary | ICD-10-CM | POA: Diagnosis not present

## 2017-04-21 DIAGNOSIS — I6300 Cerebral infarction due to thrombosis of unspecified precerebral artery: Secondary | ICD-10-CM | POA: Diagnosis not present

## 2017-04-21 HISTORY — DX: Paroxysmal atrial fibrillation: I48.0

## 2017-04-21 NOTE — Patient Instructions (Signed)
Stressed the importance of management of risk factors to prevent further stroke Continue Eliquis for secondary stroke prevention and atrial fibrillation Maintain strict control of hypertension with blood pressure goal below 130/90, today's reading 131/87 continue antihypertensive medications Control of diabetes with hemoglobin A1c below 6.5 followed by primary care most recent hemoglobin A1c 5.3 Cholesterol with LDL cholesterol less than 70, followed by primary care,  most recent 115 continue statin drug Lipitor Exercise by walking, recommend 30 minutes a day  eat healthy diet with whole grains,  fresh fruits and vegetables lean meats chicken and fish Follow-up in 6 months

## 2017-04-22 NOTE — Progress Notes (Signed)
I agree with the above plan 

## 2017-06-05 ENCOUNTER — Other Ambulatory Visit: Payer: Self-pay

## 2017-06-05 NOTE — Patient Outreach (Signed)
Telephone outreach to patient to obtain mRS was successfully completed. mRS = 0 

## 2017-07-21 NOTE — Progress Notes (Addendum)
CARDIOLOGY OFFICE NOTE  Date:  07/22/2017    Zoe Andrade Date of Birth: 09/13/1940 Medical Record #[id number]  PCP:  Raelyn Number, MD  Cardiologist:  [facility]   Chief Complaint  Patient presents with  . Atrial Fibrillation    Follow up visit - seen for Dr. Marlou Porch    History of Present Illness: Zoe Andrade is a 77 y.o. female who presents today for a follow up visit. Seen for Dr. Marlou Porch.   She was seen here back in October for evaluation of paroxysmal atrial fibrillation at the request of Dr. Leonie Man.  She had been seen at [facility] for palpitations and sweating and was diagnosed with new onset atrial fibrillation. She was started on both Eliquis and aspirin. It was recommended that she discontinue the aspirin and stay on Eliquis alone.  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 There was also an incidental finding of potential meningioma at T9.  Comes in today. Here with her daughter. She is pretty tearful. Husband just died - was sick for about a month over the holidays and then died. She says she is doing ok. No chest pain. She is not aware of any AF. Denies palpitations. Has seen her PCP recently - has actually switched - feels like she has had her labs done.   She and her daughter are not aware of the aortic aneurysm and do not feel like the possible spinal lesion were ever addressed.   Past Medical History:  Diagnosis Date  . Cancer ([place])   . Hypertension   . 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 Stroke (cerebrum) ([place]) 02/25/2017    Past Surgical History:  Procedure Laterality Date  . MASTECTOMY Right   . TEE WITHOUT CARDIOVERSION N/A 03/02/2017   Procedure: TRANSESOPHAGEAL ECHOCARDIOGRAM (TEE);  Surgeon: Dorothy Spark, MD;  Location: [facility];  Service: Cardiovascular;  Laterality: N/A;     Medications: Current Meds  Medication Sig  . apixaban (ELIQUIS) 5 MG TABS tablet Take 1 tablet (5 mg total) by mouth  2 (two) times daily.  Marland Kitchen atenolol (TENORMIN) 25 MG tablet Take 0.5 tablets (12.5 mg total) by mouth daily.  Marland Kitchen atorvastatin (LIPITOR) 20 MG tablet Take 1 tablet (20 mg total) by mouth daily at 6 PM.  . felodipine (PLENDIL) 10 MG 24 hr tablet Take 10 mg by mouth daily.     Allergies: No Known Allergies  Social History: The patient  reports that  has never smoked. she has never used smokeless tobacco. She reports that she does not drink alcohol or use drugs.   Family History: The patient's family history includes Breast cancer in her sister; Congestive Heart Failure in her father; Heart attack (age of onset: 41) in her brother; High blood pressure in her daughter, father, and mother; Hyperthyroidism in her sister.   Review of Systems: Please see the history of present illness.   Otherwise, the review of systems is positive for none.   All other systems are reviewed and negative.   Physical Exam: VS:  BP 120/80 (BP Location: Left Arm, Patient Position: Sitting, Cuff Size: Normal)   Pulse 63   Ht 5\' 4"  (1.626 m)   Wt 145 lb (65.8 kg)   LMP  (LMP Unknown)   BMI 24.89 kg/m  .  BMI Body mass index is 24.89 kg/m.  Wt Readings from Last 3 Encounters:  07/22/17 145 lb (65.8 kg)  04/21/17 155 lb (70.3  kg)  03/17/17 155 lb 9.6 oz (70.6 kg)    General: Pleasant. Tearful but alert and in no acute distress.   HEENT: Normal.  Neck: Supple, no JVD, carotid bruits, or masses noted.  Cardiac: Regular rate and rhythm. No murmurs, rubs, or gallops. No edema.  Respiratory:  Lungs are clear to auscultation bilaterally with normal work of breathing.  GI: Soft and nontender.  MS: No deformity or atrophy. Gait and ROM intact.  Skin: Warm and dry. Color is normal.  Neuro:  Strength and sensation are intact and no gross focal deficits noted.  Psych: Alert, appropriate and with normal affect.   LABORATORY DATA:  EKG:  EKG is ordered today. This demonstrates NSR.  Lab Results  Component Value  Date   WBC 8.1 03/02/2017   HGB 13.0 03/02/2017   HCT 38.2 03/02/2017   PLT 164 03/02/2017   GLUCOSE 107 (H) 03/02/2017   CHOL 186 02/26/2017   TRIG 101 02/26/2017   HDL 51 02/26/2017   LDLCALC 115 (H) 02/26/2017   ALT 21 02/25/2017   AST 23 02/25/2017   NA 138 03/02/2017   K 3.7 03/02/2017   CL 109 03/02/2017   CREATININE 0.71 03/02/2017   BUN 11 03/02/2017   CO2 25 03/02/2017   INR 0.92 02/25/2017   HGBA1C 5.3 02/26/2017     BNP (last 3 results) No results for input(s): BNP in the last 8760 hours.  ProBNP (last 3 results) No results for input(s): PROBNP in the last 8760 hours.   Other Studies Reviewed Today:  TTE 02/2017 - Left ventricle: The cavity size was normal. Systolic function was normal. The estimated ejection fraction was in the range of 60%to 65%. Wall motion was normal; there were no regional wallmotion abnormalities. Doppler parameters are consistent with abnormal left ventricular relaxation (grade 1 diastolic dysfunction). - Left atrium: The atrium was mildly dilated. Anterior-posterior dimension: 41 mm. Impressions: - No cardiac source of emboli was indentified.  Lower Extremity US (DVT) 02/28/2017 No evidence of deep vein or superficial thrombosis involving the right lower extremity and left lower extremity.  TEE 03/02/2017 Left Ventrical:LVEF 60-65% Mitral Valve:No MR Aortic Valve:No AI Tricuspid Valve: Mild TR Pulmonic Valve: No PR Left Atrium/ Left atrial appendage: No thrombus, normal emptying and filling velocities. Atrial septum:Hypermobile, lipomatous hypertrophy of the interatrial septum. No PFO, negative bubble study. Aorta:Severe non-mobile atheroma in the descending thoracic aorta. Ascending aorta is dilated measuring 42-43 mm, a dissection can't be excluded on current images, a dedicated chest CTA will be ordered, this was discussed with Dr Leonie Man, the patient and her daughter. They agree. She will be referred to CV surgery  and to me for a follow up.   CT angio of chest 02/2017: 1. Aneurysmal dilatation of the ascending thoracic aorta, measuring up to 4.2 cm. No evidence of dissection. Recommend annual imaging followup by CTA or MRA. This recommendation follows 2010 ACCF/AHA/AATS/ACR/ASA/SCA/SCAI/SIR/STS/SVM Guidelines for the Diagnosis and Management of Patients with Thoracic Aortic Disease. Circulation. 2010; 121: L465-K354 2. Round 8 mm calcified lesion in the right aspect of the spinal canal at the level of T8, possibly a meningioma. Consider further evaluation with nonemergent/outpatient contrast-enhanced MRI of the thoracic spine.  Assessment/Plan:  1. PAF - remains in NSR by exam and EKG. Remains on anticoagulation.   2. Aortic atherosclerosis - would favor risk factor modification. She is on lipid lowering therapy.   3. Ascending aortic aneurysm - they are asking for referral - precautions given as well.  4. Stroke - looks to have recovered pretty well.   5. High Risk Medicine - labs recently checked by PCP - she will try to get me a copy.   6. 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    Current medicines are reviewed with the patient today.  The patient does not have concerns regarding medicines other than what has been noted above.  The following changes have been made:  See above.  Labs/ tests ordered today include:    Orders Placed This Encounter  Procedures  . Ambulatory referral to Cardiothoracic Surgery  . EKG 12-Lead     Disposition:   FU with Dr. Marlou Porch in 6 months.   Patient is agreeable to this plan and will call if any problems develop in the interim.   SignedTruitt Merle, NP  07/22/2017 11:33 AM  [facility] [address] [place], [place]  [address] Phone: [phone] Fax: [phone]       Addendum: 08/24/17  See  below phone message - referral to [facility] had been made at daughter's request. Noted that they left without being seen.   I have called Kim this morning to discuss. I have explained to her that the plan of care for an aortic aneurysm would best be made by CT surgery in the event of an aortic dissection what would happen and the necessary precautions/meds to avoid, etc would be discussed. She is planning on making appointment at [facility].    Sent: 08/20/2017  2:13 PM  To: [name], NP  Subject: appointment with Nils Pyle            The patients daughter Maudie Mercury is calling to discuss concerns she has about a recent appointment with Dr Prescott Gum.  The daughter states her mother never got to see the doctor. She says they waited for over 3 hours and finally left without being seen. Maudie Mercury states she values your opinion and would like to know the plan of care for her mother, but will "never go back to that office again". She wants to know what testing should be done next. Requesting a call from you.    Thank you.

## 2017-07-22 ENCOUNTER — Encounter (INDEPENDENT_AMBULATORY_CARE_PROVIDER_SITE_OTHER): Payer: Self-pay

## 2017-07-22 ENCOUNTER — Ambulatory Visit: Payer: Medicare HMO | Admitting: Nurse Practitioner

## 2017-07-22 ENCOUNTER — Encounter: Payer: Self-pay | Admitting: Nurse Practitioner

## 2017-07-22 VITALS — BP 120/80 | HR 63 | Ht 64.0 in | Wt 145.0 lb

## 2017-07-22 DIAGNOSIS — I712 Thoracic aortic aneurysm, without rupture: Secondary | ICD-10-CM | POA: Diagnosis not present

## 2017-07-22 DIAGNOSIS — I700 Atherosclerosis of aorta: Secondary | ICD-10-CM

## 2017-07-22 DIAGNOSIS — I7121 Aneurysm of the ascending aorta, without rupture: Secondary | ICD-10-CM

## 2017-07-22 DIAGNOSIS — Z79899 Other long term (current) drug therapy: Secondary | ICD-10-CM

## 2017-07-22 DIAGNOSIS — I480 Paroxysmal atrial fibrillation: Secondary | ICD-10-CM

## 2017-07-22 DIAGNOSIS — Z7901 Long term (current) use of anticoagulants: Secondary | ICD-10-CM

## 2017-07-22 NOTE — Patient Instructions (Addendum)
We will be checking the following labs today - NONE  Ask your PCP to send me your labs - specifically a CBC please   Medication Instructions:    Continue with your current medicines.     Testing/Procedures To Be Arranged:  N/A  Follow-Up:   See me or Dr. Marlou Porch in 6 months.   I have placed a referral to [facility]    Other Special Instructions:   N/A    If you need a refill on your cardiac medications before your next appointment, please call your pharmacy.                                Ascending Aortic Aneurysm/ Thoracic Aortic Aneurysm   Recent studies have raised concern that fluoroquinolone antibiotics could be associated with an increased risk of aortic aneurysm or aortic dissection. You should avoid use of Cipro and other associated antibiotics (flouroquinolone antibiotics )  It is  best to avoid activities that cause grunting or straining (medically referred to as a "valsalva maneuver"). This happens when a person bears down against a closed throat to increase the strength of arm or abdominal muscles. There's often a tendency to do this when lifting heavy weights, doing sit-ups, push-ups or chin-ups, etc., but it may be harmful.     An aneurysm is a bulge in an artery. It happens when blood pushes up against a weakened or damaged artery wall. A thoracic aortic aneurysm is an aneurysm that occurs in the first part of the aorta, between the heart and the diaphragm. The aorta is the main artery of the body. It supplies blood from the heart to the rest of the body. Some aneurysms may not cause symptoms or problems. However, the major concern with a thoracic aortic aneurysm is that it can enlarge and burst (rupture), or blood can flow between the layers of the wall of the aorta through a tear (aorticdissection). Both of these conditions can cause bleeding inside the body and can be life-threatening if they are not diagnosed and treated  right away. What are the causes? The exact cause of this condition is not known. What increases the risk? The following factors may make you more likely to develop this condition:  Being age 32 or older.  Having a hardening of the arteries caused by the buildup of fat and other substances in the lining of a blood vessel (arteriosclerosis).  Having inflammation of the walls of an artery (arteritis).  Having a genetic disease that weakens the body's connective tissue, such as Marfan syndrome.  Having an injury or trauma to the aorta.  Having an infection that is caused by bacteria, such as syphilis or staphylococcus, in the wall of the aorta (infectious aortitis).  Having high blood pressure (hypertension).  Being female.  Being white (Caucasian).  Having high cholesterol.  Having a family history of aneurysms.  Using tobacco.  Having chronic obstructive pulmonary disease (COPD). What are the signs or symptoms? Symptoms of this condition vary depending on the size and rate of growth of the aneurysm. Most grow slowly and do not cause any symptoms. When symptoms do occur, they may include:  Pain in the chest, back, sides, or abdomen. The pain may vary in intensity. A sudden onset of severe pain may indicate that the aneurysm has ruptured.  Hoarseness.  Cough.  Shortness of breath.  Swallowing problems.  Swelling in  the face, arms, or legs.  Fever.  Unexplained weight loss. How is this diagnosed? This condition may be diagnosed with:  An ultrasound.  X-rays.  A CT scan.  An MRI.  Tests to check the arteries for damage or blockages (angiogram). Most unruptured thoracic aortic aneurysms cause no symptoms, so they are often found during exams for other conditions. How is this treated? Treatment for this condition depends on:  The size of the aneurysm.  How fast the aneurysm is growing.  Your age.  Risk factors for rupture. Aneurysms that are smaller than  2.2 inches (5.5 cm) may be managed by using medicines to control blood pressure, manage pain, or fight infection. You may need regular monitoring to see if the aneurysm is getting bigger. Your health care provider may recommend that you have an ultrasound every year or every 6 months. How often you need to have an ultrasound depends on the size of the aneurysm, how fast it is growing, and whether you have a family history of aneurysms. Surgical repair may be needed if your aneurysm is larger than 2.2 inches or if it is growing quickly. Follow these instructions at home: Eating and drinking   Eat a healthy diet. Your health care provider may recommend that you:  Lower your salt (sodium) intake. In some people, too much salt can raise blood pressure and increase the risk of thoracic aortic aneurysm.  Avoid foods that are high in saturated fat and cholesterol, such as red meat and dairy.  Eat a diet that is low in sugar.  Increase your fiber intake by including whole grains, vegetables, and fruits in your diet. Eating these foods may help to lower blood pressure.  Limit or avoid alcohol as recommended by your health care provider. Lifestyle   Follow instructions from your health care provider about healthy lifestyle habits. Your health care provider may recommend that you:  Do not use any products that contain nicotine or tobacco, such as cigarettes and e-cigarettes. If you need help quitting, ask your health care provider.  Keep your blood pressure within normal limits. The target limit for most people is below 120/80. Check your blood pressure regularly. If it is high, ask your health care provider about ways that you can control it.  Keep your blood sugar (glucose) level and cholesterol levels within normal limits. Target limits for most people are:  Blood glucose level: Less than 100 mg/dL.  Total cholesterol level: Less than 200 mg/dL.  Maintain a healthy weight. Activity   Stay  physically active and exercise regularly. Talk with your health care provider about how often you should exercise and ask which types of exercise are safe for you.  Avoid heavy lifting and activities that take a lot of effort (are strenuous). Ask your health care provider what activities are safe for you. General instructions   Keep all follow-up visits as told by your health care provider. This is important.  Talk with your health care provider about regular screenings to see if the aneurysm is getting bigger.  Take over-the-counter and prescription medicines only as told by your health care provider. Contact a health care provider if:  You have discomfort in your upper back, neck, or abdomen.  You have trouble swallowing.  You have a cough or hoarseness.  You have a family history of aneurysms.  You have unexplained weight loss. Get help right away if:  You have sudden, severe pain in your upper back and abdomen. This pain may  move into your chest and arms.  You have shortness of breath.  You have a fever. This information is not intended to replace advice given to you by your health care provider. Make sure you discuss any questions you have with your health care provider. Document Released: 06/02/2005 Document Revised: 03/14/2016 Document Reviewed: 03/14/2016 Elsevier Interactive Patient Education  2017 [place].   Aortic Dissection An aortic dissection happens when there is a tear in the main blood vessel of the body (aorta). The aorta comes out of the heart, curves around, and then goes down the chest (thoracic aorta) and into the abdomen (abdominal aorta) to supply arteries with blood. The wall of the aorta has inner and outer layers. Aortic dissection occurs most often in the thoracic aorta. As the tear widens and blood flows through it, the aorta becomes "double-barreled." This means that one part of the aorta continues to carry blood to the body, but blood also flows  into the tear, between the layers of the aorta. The torn part of the aorta fills with blood and swells up. This can reduce blood flow through the part of the aorta that is still supplying blood to the body. Aortic dissection is a medical emergency. What are the causes? An aortic dissection is commonly caused by weakening of the artery wall due to high blood pressure. Other causes may include:  An injury, such as from a car crash.  Birth defects that affect the heart (congenital heart defects).  Thickening of the artery walls. In some cases, the cause is not known. What increases the risk? The following factors may make you more likely to develop this condition:  Having certain medical conditions, such as:  High blood pressure (hypertension).  Hardening and narrowing of the arteries (atherosclerosis).  A genetic disorder that affects the connective tissue, such as Marfan syndrome or Ehlers-Danlos syndrome.  A condition that causes inflammation of blood vessels, such as giant cell arteritis.  Having a chest injury.  Having surgery on the aorta.  Being born with a congenital heart defect.  Being female.  Being older than age 19.  Using cocaine.  Smoking.  Lifting heavy weights or doing other types of high-intensity resistance training. What are the signs or symptoms? Signs and symptoms of aortic dissection start suddenly. The most common symptoms are:  Severe chest pain that may feel like tearing, stabbing, or sharp pain.  Severe pain that spreads (radiates) to the back, neck, jaw, or abdomen. Other symptoms may include:  Trouble breathing.  Dizziness or fainting.  Sudden weakness on one side of the body.  Nausea or vomiting.  Trouble swallowing.  Coughing up blood.  Vomiting blood.  Clammy skin. How is this diagnosed? This condition may be diagnosed based on:  Your symptoms.  A physical exam. This may include:  Listening for abnormal blood flow sounds  (murmurs) in your chest or abdomen.  Checking your pulse in your arms and legs.  Checking your blood pressure to see whether it is low or whether there is a difference between the measurements in your right and left arm.  Electrocardiogram (ECG). This test measures the electrical activity in your heart.  Chest X-ray.  CT scan.  MRI.  Aortic angiogram. This test involves injecting dye to make it easier to see your blood vessels clearly.  Echocardiogram to study your heart using sound waves.  Blood tests. How is this treated? It is important to treat an aortic dissection as quickly as possible. Treatment may  start as soon as your health care provider thinks that you have aortic dissection. Treatment depends on the location and severity of your dissection and your overall health. Treatment may include:  Medicines to lower your blood pressure.  Surgery to repair the dissected part of your aorta with artificial material (syntheticgraft).  A medical procedure to insert a stent-graft into the aorta (endovascular procedure). During this procedure, a long, thin tube (stent) is inserted into an artery near the groin (femoral artery) and moved up to the damaged part of the aorta. Then, the stent is opened to help improve blood flow and prevent future dissection. Follow these instructions at home: Activity   Avoid activities that could injure your chest or your abdomen. Ask your health care provider what activities are safe for you.  After you have recovered, try to stay active. Ask your health care provider what activities are safe for you after recovery.  Do not lift anything that is heavier than 10 lb (4.5 kg) until your health care provider approves.  Do not drive or use heavy machinery while taking prescription pain medicine. Eating and drinking   Eat a heart-healthy diet, which includes lots of fresh fruits and vegetables, low-fat (lean) protein, and whole grains.  Check  ingredients and nutrition facts on packaged foods and beverages, and avoid foods with high amounts of:  Salt (sodium).  Saturated fats (like red meat).  Trans fats (like fried food). General instructions   Take over-the-counter and prescription medicines only as told by your health care provider.  Work with your health care provider to manage your blood pressure.  Talk with your health care provider about how to manage stress.  Do not use any products that contain nicotine or tobacco, such as cigarettes and e-cigarettes. If you need help quitting, ask your health care provider.  Keep all follow-up visits as told by your health care provider. This is important. Get help right away if:  You develop any symptoms of aortic dissection after treatment, including severe pain in your chest, back, or abdomen.  You have a pain in your abdomen.  You have trouble breathing or you develop a cough.  You faint.  You develop a racing heartbeat. These symptoms may represent a serious problem that is an emergency. Do not wait to see if the symptoms will go away. Get medical help right away. Call your local emergency services (911 in the [place] Do not drive yourself to the hospital. Summary  An aortic dissection happens when there is a tear in the main blood vessel of the body (aorta). It is a medical emergency.  The most common symptom is severe pain in the chest that spreads (radiates) to the back, neck, jaw, or abdomen.  It is important to treat an aortic dissection as quickly as possible. Treatment typically includes surgery and medicines. This information is not intended to replace advice given to you by your health care provider. Make sure you discuss any questions you have with your health care provider. Document Released: 09/09/2007 Document Revised: 04/21/2016 Document Reviewed: 04/21/2016 Elsevier Interactive Patient Education  2017 [employer].    Call the [place] office at [phone] if you have any questions, problems or concerns.

## 2017-07-27 ENCOUNTER — Telehealth: Payer: Self-pay | Admitting: Nurse Practitioner

## 2017-07-27 NOTE — Telephone Encounter (Signed)
New Message     Patient daughter is calling stating that her mother is to have a scan done of her heart, there is no order in system , and she is not certain what type of scan it is to be

## 2017-07-27 NOTE — Telephone Encounter (Signed)
S/w pt daughter per [facility]) stated pt was supposed to have scan done, stated that is up to [place] office.  Stated pt cannot keep this appt., gave daughter the number to call [facility] and reschedule.

## 2017-08-19 ENCOUNTER — Encounter: Payer: Medicare HMO | Admitting: Cardiothoracic Surgery

## 2017-08-19 NOTE — Progress Notes (Signed)
This encounter was created in error - please disregard.

## 2017-08-20 ENCOUNTER — Telehealth: Payer: Self-pay | Admitting: *Deleted

## 2017-08-20 NOTE — Telephone Encounter (Signed)
I spoke with pt's daughter and let her know Truitt Merle, NP was out of the office until next week.  Daughter has questions she would like to ask Cecille Rubin regarding treatment plan for pt.  She is fine with waiting for call back next week.

## 2017-09-16 ENCOUNTER — Encounter: Payer: Self-pay | Admitting: Cardiothoracic Surgery

## 2017-09-16 ENCOUNTER — Institutional Professional Consult (permissible substitution): Payer: Medicare HMO | Admitting: Cardiothoracic Surgery

## 2017-09-16 VITALS — BP 107/66 | HR 78 | Resp 20 | Ht 64.0 in | Wt 145.0 lb

## 2017-09-16 DIAGNOSIS — I712 Thoracic aortic aneurysm, without rupture, unspecified: Secondary | ICD-10-CM

## 2017-09-16 DIAGNOSIS — I10 Essential (primary) hypertension: Secondary | ICD-10-CM

## 2017-09-16 NOTE — Progress Notes (Signed)
PCP is Raelyn Number, MD Referring Provider is [name], NP  Chief Complaint  Patient presents with  . 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  No intracardiac source of embolus was noted however the ascending aorta was dilated.  Aortic valve has 3 leaflets and does not have insufficiency.  There are no previous echocardiogram or CT scan of chest images with which to review the current size of the aorta.  She denies significant chest pain or upper back pain.  Patient denies family history of aneurysm either thoracic or abdominal.  Patient states she is compliant with her blood pressure medication and checks her blood pressure weekly and it is always normal.  Patient was diagnosed with atrial fibrillation approximately 2 weeks after she left the hospital after stroke and was placed on apixaban.  Past Medical History:  Diagnosis Date  . Cancer ([place])   . Hypertension   . 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 MASTECTOMY Right   . TEE WITHOUT CARDIOVERSION N/A 03/02/2017   Procedure: TRANSESOPHAGEAL ECHOCARDIOGRAM (TEE);  Surgeon: Dorothy Spark, MD;  Location[facility] END[facility]COPY;  Service: Cardiovascular;  Laterality: N/A;    Family History  Problem Relation Age of Onset  . High blood pressure Mother   . High blood pressure Father   . Congestive Heart Failure Father   . Breast  cancer Sister   . Hyperthyroidism Sister   . Heart attack Brother 72  . High blood pressure Daughter        age 53    Social History Social History   Tobacco Use  . Smoking status: Never Smoker  . Smokeless tobacco: Never Used  Substance Use Topics  . Alcohol use: No    Comment: uta  . Drug use: No    Current Outpatient Medications  Medication Sig Dispense Refill  . apixaban (ELIQUIS) 5 MG TABS tablet Take 1 tablet (5 mg total) by mouth 2 (two) times daily. 180 tablet 3  . atenolol (TENORMIN) 25 MG tablet Take 0.5 tablets (12.5 mg total) by mouth daily. 15 tablet 0  . atorvastatin (LIPITOR) 20 MG tablet Take 1 tablet (20 mg total) by mouth daily at 6 PM. 30 tablet 0  . felodipine (PLENDIL) 10 MG 24 hr tablet Take 10 mg by mouth daily.     No current facility-administered medications for this visit.     No Known Allergies  Review of Systems      Patient lives alone-her husband of 82 years died 2 months ago     Patient denies any obvious thoracic trauma or pneumothorax     The patient is a never smoker Patient is right-hand dominant       Review of Systems :  [ y ] = yes, [  ] = no        General :  Weight gain [   ]    Weight loss  [   ]  Fatigue [  ]  Fever [  ]  Chills  [  ]                                Weakness  [  ]           HEENT    Headache [  ]  Dizziness [  ]  Blurred vision [  ] Glaucoma  [  ]                          Nosebleeds [  ] Painful or loose teeth [  ]        Cardiac :  Chest pain/ pressure [  ]  Resting SOB [  ] exertional SOB [  ]                        Orthopnea [  ]  Pedal edema  [  ]  Palpitations Blue.Reese  ] Syncope/presyncope [ ]                         Paroxysmal nocturnal dyspnea [  ]         Pulmonary : cough [  ]  wheezing [  ]  Hemoptysis [  ] Sputum [  ] Snoring [  ]                              Pneumothorax [  ]  Sleep apnea [  ]        GI : Vomiting [  ]  Dysphagia [  ]  Melena  [  ]  Abdominal pain [  ] BRBPR [  ]              Heart  burn [  ]  Constipation [  ] Diarrhea  [  ] Colonoscopy [   ]        GU : Hematuria [  ]  Dysuria [  ]  Nocturia [  ] UTI's [  ]        Vascular : Claudication [  ]  Rest pain [  ]  DVT [  ] Vein stripping [  ] leg ulcers [  ]                          TIA [  ] Stroke Blue.Reese  ]  Varicose veins [  ]        NEURO :  Headaches  [  ] Seizures [  ] Vision changes [  ] Paresthesias [  ]                                       Seizures [  ]        Musculoskeletal :  Arthritis [  ] Gout  [  ]  Back pain [  ]  Joint pain Blue.Reese  ]        Skin :  Rash [  ]  Melanoma [  ] Sores [  ]        Heme : Bleeding problems [  ]Clotting Disorders [  ]  Anemia [  ]Blood Transfusion [ ]         Endocrine : Diabetes [  ] Heat or Cold intolerance [  ] Polyuria [  ]excessive thirst [ ]         Psych : Depression [  ]  Anxiety [  ]  Psych hospitalizations [  ] Memory change [  ]                                               BP 107/66   Pulse 78   Resp 20   Ht 5\' 4"  (1.626 m)   Wt 145 lb (65.8 kg)   LMP  (LMP Unknown)   SpO2 96% Comment: RA  BMI 24.89 kg/m  Physical Exam     Physical Exam  General: Well-nourished pleasant 77 year old female no acute distress HEENT: Normocephalic pupils equal , dentition adequate Neck: Supple without JVD, adenopathy, or bruit Chest: Clear to auscultation, symmetrical breath sounds, no rhonchi, no tenderness Cardiovascular: Regular rate and rhythm, no murmur, no gallop, peripheral pulses             palpable in all extremities Abdomen:  Soft, nontender, no palpable mass or organomegaly Extremities: Warm, well-perfused, no clubbing cyanosis edema or tenderness,              no venous stasis changes of the legs Rectal/GU: Deferred Neuro: Grossly non--focal and symmetrical throughout Skin: Clean and dry without rash or ulceration   Diagnostic Tests: CTA shows a smooth fusiform ascending aneurysm measuring 4.2 cm diameter from the sinotubular junction to the proximal arch  without mural thrombus or ulceration and minimal calcification.  Impression: Asymptomatic 4.2 cm ascending aneurysm Risk of dissection is extremely low-less than 2% Surgery not recommended unless diameter exceeds 5 cm Best therapy is blood pressure control, Lipitor, annual surveillance scan of the aorta and avoidance of fluoroquinolones[Cipro, Levaquin] which can weaken connective tissue in the body  Plan: Patient return for follow-up CTA later this fall 1 year after her initial scan.  We discussed the diagnosis of ascending aneurysm, the risk of dissection, the symptoms of dissection, and the likelihood that with proper medical therapy she would not need surgery.  Len Childs, MD [facility] [phone]

## 2017-09-23 ENCOUNTER — Encounter: Payer: Medicare HMO | Admitting: Cardiothoracic Surgery

## 2017-10-20 NOTE — Progress Notes (Signed)
[facility]  PATIENT: Yasamin Karel DOB: 11/17/1940   REASON FOR VISIT: follow-up for stroke HISTORY FROM: Patient   HISTORY OF PRESENT ILLNESS:PER RECORDAnna Zenaida Tesar an 77 y.o.femalewho presented acutely to the ED via EMS as a Code Stroke with sudden onset of left sided weakness, left facial droop, rightward gaze deviation and slurred speech. She woke up this AM in her USOH. Later in the morning while sitting down at a restaurant with her husband she became unresponsive and then, when she started to speak again, the above symptoms were noted. Symptoms began at 0900. She has no prior history of stroke. She is not on an antiplatelet agent or a blood thinner. Her only home medications are Plendil and Tenormin.  LSN:0900 tPA Given:Yes, administered IV t-PA at 02/25/2017 at 1015 She was admitted to the neuro ICU for further evaluation and treatment. SUBJECTIVE (INTERVAL HISTORY) Her husband and one daughter are at the bedside. She Feels she is doing better and has improved left-sided strength..  She is waiting for CIR placement. She complained of redness in the right antecubital region at the site of tape removal from an IV which had been placed by EMS. Interval history 11/6/2018CM Ms.Tomczak, 77 year old female returns for follow-up after being admitted to the hospital for stroke event  September 12th 2018.  MRI of the brain right MCA infarct MRA of the head  right M2 persistent occlusion CTA head and neck right M 2 occlusion.  2D echo 60-65 % EF.  Bubble study was negative LDL 115.  Hemoglobin A1c 5.3.  She was discharged on 03/02/17  and went to [place] with her daughter.  On 02/1817 she was admitted to [facility] new diagnosis of atrial fib and she was placed on Eliquis 5 mg twice daily.  She did not feel any palpitations and did not know her heart rate was irregular.  Her home physical therapy and occupational therapy has just concluded last week.  She remains  on Eliquis without further stroke or TIA symptoms.  She has minimal bruising and no signs of bleeding.  Blood pressure in the office today 131/87.  She is on Lipitor for hyperlipidemia, she denies myalgias.  She is followed by Dr. Luther Parody cardiology.  She was referred to cardiothoracic surgery to discuss her ascending aortic aneurysm.  She is trying to exercise diet.  Patient claims she snores but denies any daytime drowsiness.  Husband is not aware of any periods of apnea during sleep, she returns for reevaluation UPDATE 5/8/2019CM Ms.Twist, 77 year old female returns for follow-up with history of stroke in September 2018.  She also had a new diagnosis of atrial fibrillation.  She is now on Eliquis for secondary stroke prevention and atrial fibrillation.  She has minimal bruising and no bleeding.  She remains on Lipitor without myalgias.  Blood pressure in the office today 124/78.  She reports that her husband died suddenly in 2022-07-21.  She was encouraged to go to bereavement.  She is continuing to exercise.  She intermittently continues to have weak grip in the left hand.  She continues to be followed by cardiology and vascular surgery for her ascending aortic aneurysm.  She returns for reevaluation.   REVIEW OF SYSTEMS: Full 14 system review of systems performed and notable only for those listed, all others are neg:  Constitutional: neg  Cardiovascular: neg Ear/Nose/Throat: neg  Skin: neg Eyes: neg Respiratory: neg Gastroitestinal: neg  Hematology/Lymphatic: neg  Endocrine: neg Musculoskeletal:neg Allergy/Immunology: neg Neurological: neg Psychiatric: neg  Sleep : snoring   ALLERGIES: No Known Allergies  HOME MEDICATIONS: Outpatient Medications Prior to Visit  Medication Sig Dispense Refill  . apixaban (ELIQUIS) 5 MG TABS tablet Take 1 tablet (5 mg total) by mouth 2 (two) times daily. 180 tablet 3  . atenolol (TENORMIN) 25 MG tablet Take 0.5 tablets (12.5 mg total) by mouth daily. 15  tablet 0  . atorvastatin (LIPITOR) 20 MG tablet Take 1 tablet (20 mg total) by mouth daily at 6 PM. 30 tablet 0  . felodipine (PLENDIL) 10 MG 24 hr tablet Take 10 mg by mouth daily.     No facility-administered medications prior to visit.     PAST MEDICAL HISTORY: Past Medical History:  Diagnosis Date  . Cancer ([place])   . Hypertension   . 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 Stroke (cerebrum) ([place]) 02/25/2017    PAST SURGICAL HISTORY: Past Surgical History:  Procedure Laterality Date  . MASTECTOMY Right   . TEE WITHOUT CARDIOVERSION N/A 03/02/2017   Procedure: TRANSESOPHAGEAL ECHOCARDIOGRAM (TEE);  Surgeon: Dorothy Spark, MD;  Location[facility] END[facility]COPY;  Service: Cardiovascular;  Laterality: N/A;    FAMILY HISTORY: Family History  Problem Relation Age of Onset  . High blood pressure Mother   . High blood pressure Father   . Congestive Heart Failure Father   . Breast cancer Sister   . Hyperthyroidism Sister   . Heart attack Brother 70  . High blood pressure Daughter        age 22    SOCIAL HISTORY: Social History   Socioeconomic History  . Marital status: Married    Spouse name: Not on file  . Number of children: Not on file  . Years of education: Not on file  . Highest education level: Not on file  Occupational History  . Not on file  Social Needs  . Financial resource strain: Not on file  . Food insecurity:    Worry: Not on file    Inability: Not on file  . Transportation needs:    Medical: Not on file    Non-medical: Not on file  Tobacco Use  . Smoking status: Never Smoker  . Smokeless tobacco: Never Used  Substance and Sexual Activity  . Alcohol use: No    Comment: uta  . Drug use: No  . Sexual activity: Not on file  Lifestyle  . Physical activity:    Days per week: Not on file    Minutes per session: Not on file  . Stress: Not on file  Relationships  . Social connections:    Talks on phone: Not on file    Gets together: Not on file     Attends religious service: Not on file    Active member of club or organization: Not on file    Attends meetings of clubs or organizations: Not on file    Relationship status: Not on file  . Intimate partner violence:    Fear of current or ex partner: Not on file    Emotionally abused: Not on file    Physically abused: Not on file    Forced sexual activity: Not on file  Other Topics Concern  . Not on file  Social History Narrative  . Not on file     PHYSICAL EXAM  Vitals:   10/21/17 0937  BP: 124/78  Pulse: 72  Weight: 142 lb 9.6 oz (64.7 kg)  Height: 5\' 4"  (1.626 m)   Body mass index is 24.48 kg/m.  Generalized: Well developed, in no acute distress  Head: normocephalic and atraumatic,. Oropharynx benign  Neck: Supple, no carotid bruits  Cardiac: Regular rate rhythm, no murmur  Musculoskeletal: No deformity   Neurological examination   Mentation: Alert oriented to time, place, history taking. Attention span and concentration appropriate. Recent and remote memory intact.  Follows all commands speech and language fluent.   Cranial nerve II-XII: .Pupils were equal round reactive to light extraocular movements were full, visual field were full on confrontational test. Facial sensation and strength were normal. hearing was intact to finger rubbing bilaterally. Uvula tongue midline. head turning and shoulder shrug were normal and symmetric.Tongue protrusion into cheek strength was normal. Motor: normal bulk and tone, full strength in the BUE, BLE, grip strength 5/5  bilaterally Sensory: normal and symmetric to light touch, pinprick, and  Vibration, in the upper and lower extremities  Coordination: finger-nose-finger, heel-to-shin bilaterally, no dysmetria, no tremor Reflexes: 1+ upper lower and symmetric, plantar responses were flexor bilaterally. Gait and Station: Rising up from seated position without assistance, normal stance,  moderate stride, good arm swing, smooth turning,  able to perform tiptoe, and heel walking without difficulty. Tandem gait is steady.  No assistive device  DIAGNOSTIC DATA (LABS, IMAGING, TESTING) - I reviewed patient records, labs, notes, testing and imaging myself where available.  Lab Results  Component Value Date   WBC 8.1 03/02/2017   HGB 13.0 03/02/2017   HCT 38.2 03/02/2017   MCV 89.3 03/02/2017   PLT 164 03/02/2017      Component Value Date/Time   NA 138 03/02/2017 0144   K 3.7 03/02/2017 0144   CL 109 03/02/2017 0144   CO2 25 03/02/2017 0144   GLUCOSE 107 (H) 03/02/2017 0144   BUN 11 03/02/2017 0144   CREATININE 0.71 03/02/2017 0144   CALCIUM 9.2 03/02/2017 0144   PROT 7.0 02/25/2017 0946   ALBUMIN 4.0 02/25/2017 0946   AST 23 02/25/2017 0946   ALT 21 02/25/2017 0946   ALKPHOS 87 02/25/2017 0946   BILITOT 0.7 02/25/2017 0946   GFRNONAA >60 03/02/2017 0144   GFRAA >60 03/02/2017 0144   Lab Results  Component Value Date   CHOL 186 02/26/2017   HDL 51 02/26/2017   LDLCALC 115 (H) 02/26/2017   TRIG 101 02/26/2017   CHOLHDL 3.6 02/26/2017   Lab Results  Component Value Date   HGBA1C 5.3 02/26/2017   ASSESSMENT AND PLAN Ms. Rashaun Curl is a 77 y.o. female with history of HTN presenting with left sided weakness and right gaze, slurry speech. She received IV t-PA and symptoms much improved.  She was discharged on 03/02/2017 and went home with her daughter in [place].  She was readmitted to [facility] on 918 new diagnosis of atrial fibrillation and placed on Eliquis 5 mg twice daily.  She has done well without recurrent stroke or TIA symptoms.   PLAN: Stressed the importance of management of risk factors to prevent further stroke Continue Eliquis for secondary stroke prevention and atrial fibrillation Maintain strict control of hypertension with blood pressure goal below 130/90, today's reading 124/78 continue antihypertensive medications Control of diabetes with hemoglobin A1c below 6.5 followed by primary  care  Cholesterol with LDL cholesterol less than 70, followed by primary care,  continue statin drug Lipitor Exercise by walking, recommend 30 minutes a day  eat healthy diet with whole grains,  fresh fruits and vegetables lean meats chicken and fish Continue follow-up with cardiology and vascular surgery Continue to follow-up  with primary care stroke risk modification, maintain blood pressure goal less than 165 systolic diabetes with hemoglobin A1c below 7 and lipids with LDL below 70 Discharge from stroke clinic Dennie Bible, [facility], [facility], [facility] [address], [place], [place] [address] [phone]

## 2017-10-21 ENCOUNTER — Encounter: Payer: Self-pay | Admitting: Nurse Practitioner

## 2017-10-21 ENCOUNTER — Ambulatory Visit: Payer: Medicare HMO | Admitting: Nurse Practitioner

## 2017-10-21 VITALS — BP 124/78 | HR 72 | Ht 64.0 in | Wt 142.6 lb

## 2017-10-21 DIAGNOSIS — I6300 Cerebral infarction due to thrombosis of unspecified precerebral artery: Secondary | ICD-10-CM

## 2017-10-21 DIAGNOSIS — E785 Hyperlipidemia, unspecified: Secondary | ICD-10-CM | POA: Diagnosis not present

## 2017-10-21 DIAGNOSIS — I10 Essential (primary) hypertension: Secondary | ICD-10-CM

## 2017-10-21 DIAGNOSIS — I480 Paroxysmal atrial fibrillation: Secondary | ICD-10-CM | POA: Diagnosis not present

## 2017-10-21 HISTORY — DX: Hyperlipidemia, unspecified: E78.5

## 2017-10-21 NOTE — Patient Instructions (Signed)
Stressed the importance of management of risk factors to prevent further stroke Continue Eliquis for secondary stroke prevention and atrial fibrillation Maintain strict control of hypertension with blood pressure goal below 130/90, today's reading 124/78 continue antihypertensive medications Control of diabetes with hemoglobin A1c below 6.5 followed by primary care  Cholesterol with LDL cholesterol less than 70, followed by primary care,  continue statin drug Lipitor Exercise by walking, recommend 30 minutes a day  eat healthy diet with whole grains,  fresh fruits and vegetables lean meats chicken and fish Continue follow-up with cardiology and vascular surgery Discharge from stroke clinic

## 2017-10-21 NOTE — Progress Notes (Signed)
I agree with the above plan 

## 2018-01-28 ENCOUNTER — Other Ambulatory Visit: Payer: Self-pay | Admitting: Cardiothoracic Surgery

## 2018-01-28 DIAGNOSIS — I712 Thoracic aortic aneurysm, without rupture, unspecified: Secondary | ICD-10-CM

## 2018-02-03 ENCOUNTER — Telehealth: Payer: Self-pay | Admitting: *Deleted

## 2018-02-03 ENCOUNTER — Other Ambulatory Visit: Payer: Self-pay | Admitting: Nurse Practitioner

## 2018-02-03 ENCOUNTER — Encounter: Payer: Self-pay | Admitting: Nurse Practitioner

## 2018-02-03 ENCOUNTER — Ambulatory Visit: Payer: Medicare HMO | Admitting: Nurse Practitioner

## 2018-02-03 VITALS — BP 98/70 | HR 67 | Ht 62.0 in | Wt 147.0 lb

## 2018-02-03 DIAGNOSIS — I712 Thoracic aortic aneurysm, without rupture: Secondary | ICD-10-CM | POA: Diagnosis not present

## 2018-02-03 DIAGNOSIS — I480 Paroxysmal atrial fibrillation: Secondary | ICD-10-CM

## 2018-02-03 DIAGNOSIS — Z79899 Other long term (current) drug therapy: Secondary | ICD-10-CM | POA: Diagnosis not present

## 2018-02-03 DIAGNOSIS — Z7901 Long term (current) use of anticoagulants: Secondary | ICD-10-CM

## 2018-02-03 DIAGNOSIS — I700 Atherosclerosis of aorta: Secondary | ICD-10-CM | POA: Diagnosis not present

## 2018-02-03 DIAGNOSIS — I7121 Aneurysm of the ascending aorta, without rupture: Secondary | ICD-10-CM

## 2018-02-03 NOTE — Patient Instructions (Addendum)
We will be checking the following labs today - NONE  Let's call your PCP to get a copy of most recent labs   Medication Instructions:    Continue with your current medicines.     Testing/Procedures To Be Arranged:  N/A  Follow-Up:   See me in 6 months    Other Special Instructions:   Monitor your BP over the next 2 weeks - keep a diary and show to your PCP for your upcoming visit with him. I suspect the Plendil needs to be cut back or changed.     If you need a refill on your cardiac medications before your next appointment, please call your pharmacy.   Call the [place] office at [phone] if you have any questions, problems or concerns.

## 2018-02-03 NOTE — Progress Notes (Signed)
CARDIOLOGY OFFICE NOTE  Date:  02/03/2018    Chasiti Waddington Date of Birth: [birth year] Medical Record #[id number]  PCP:  Raelyn Number, MD  Cardiologist:  Marisa Cyphers    Chief Complaint  Patient presents with  . Atrial Fibrillation    6 month check - seen for Dr. Marlou Porch    History of Present Illness: Zoe Andrade is a 77 y.o. female who presents today for a 6 month check. Seen for Dr. Marlou Porch.   She was seen here back in October of 2018 for evaluation of paroxysmal atrial fibrillation at the request of Dr.Sethi.  She had been seen at [facility] for palpitations and sweating and was diagnosed with new onset atrial fibrillation. She was started on both Eliquis and aspirin. It was recommended that she discontinue the aspirin and stay on Eliquis alone.  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 There was also an incidental finding of potential meningioma at T9.  I saw her back in February - she was pretty tearful - husband had just died - was otherwise doing ok. She wanted referral to [facility]. She was changing primary care doctors.   Comes in today. Here alone. She is doing well. Very active. She has been up to [facility] - daughter has moved up to [facility]. She is walking about 3 times a week. Has been released from neurology. Seeing PCP in about 2 weeks - PCP checking her labs. No bleeding. No palpitations. No syncope. Having more issues with swelling - does go down overnight. Not much salt use. BP pretty soft here today - she is not symptomatic.   Past Medical History:  Diagnosis Date  . Cancer ([place])   . Hypertension   . 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 Stroke (cerebrum) ([place]) 02/25/2017    Past Surgical History:  Procedure Laterality Date  . MASTECTOMY Right   . TEE WITHOUT CARDIOVERSION N/A 03/02/2017   Procedure: TRANSESOPHAGEAL ECHOCARDIOGRAM (TEE);  Surgeon: Dorothy Spark, MD;  Location: [facility] ENDOSCOPY;  Service:  Cardiovascular;  Laterality: N/A;     Medications: Current Meds  Medication Sig  . apixaban (ELIQUIS) 5 MG TABS tablet Take 1 tablet (5 mg total) by mouth 2 (two) times daily.  Marland Kitchen atenolol (TENORMIN) 25 MG tablet Take 0.5 tablets (12.5 mg total) by mouth daily.  Marland Kitchen atorvastatin (LIPITOR) 20 MG tablet Take 1 tablet (20 mg total) by mouth daily at 6 PM.  . felodipine (PLENDIL) 10 MG 24 hr tablet Take 10 mg by mouth daily.     Allergies: No Known Allergies  Social History: The patient  reports that she has never smoked. She has never used smokeless tobacco. She reports that she does not drink alcohol or use drugs.   Family History: The patient's family history includes Breast cancer in her sister; Congestive Heart Failure in her father; Heart attack (age of onset: 20) in her brother; High blood pressure in her daughter, father, and mother; Hyperthyroidism in her sister.   Review of Systems: Please see the history of present illness.   Otherwise, the review of systems is positive for none.   All other systems are reviewed and negative.   Physical Exam: VS:  BP 98/70 (BP Location: Left Arm, Patient Position: Sitting, Cuff Size: Normal)   Pulse 67   Ht 5\' 2"  (1.575 m)   Wt 147 lb (66.7 kg)   LMP  (LMP Unknown)   SpO2 98%  Comment: at rest  BMI 26.89 kg/m  .  BMI Body mass index is 26.89 kg/m.  Wt Readings from Last 3 Encounters:  02/03/18 147 lb (66.7 kg)  10/21/17 142 lb 9.6 oz (64.7 kg)  09/16/17 145 lb (65.8 kg)    General: Pleasant. Well developed, well nourished and in no acute distress.   HEENT: Normal.  Neck: Supple, no JVD, carotid bruits, or masses noted.  Cardiac: Regular rate and rhythm. No murmurs, rubs, or gallops. 1+ bilateral edema.  Respiratory:  Lungs are clear to auscultation bilaterally with normal work of breathing.  GI: Soft and nontender.  MS: No deformity or atrophy. Gait and ROM intact.  Skin: Warm and dry. Color is normal.  Neuro:  Strength and  sensation are intact and no gross focal deficits noted.  Psych: Alert, appropriate and with normal affect.   LABORATORY DATA:  EKG:  EKG is not ordered today.  Lab Results  Component Value Date   WBC 8.1 03/02/2017   HGB 13.0 03/02/2017   HCT 38.2 03/02/2017   PLT 164 03/02/2017   GLUCOSE 107 (H) 03/02/2017   CHOL 186 02/26/2017   TRIG 101 02/26/2017   HDL 51 02/26/2017   LDLCALC 115 (H) 02/26/2017   ALT 21 02/25/2017   AST 23 02/25/2017   NA 138 03/02/2017   K 3.7 03/02/2017   CL 109 03/02/2017   CREATININE 0.71 03/02/2017   BUN 11 03/02/2017   CO2 25 03/02/2017   INR 0.92 02/25/2017   HGBA1C 5.3 02/26/2017     BNP (last 3 results) No results for input(s): BNP in the last 8760 hours.  ProBNP (last 3 results) No results for input(s): PROBNP in the last 8760 hours.   Other Studies Reviewed Today:  TTE 02/2017 - Left ventricle: The cavity size was normal. Systolic function was normal. The estimated ejection fraction was in the range of 60%to 65%. Wall motion was normal; there were no regional wallmotion abnormalities. Doppler parameters are consistent with abnormal left ventricular relaxation (grade 1 diastolic dysfunction). - Left atrium: The atrium was mildly dilated. Anterior-posterior dimension: 41 mm. Impressions: - No cardiac source of emboli was indentified.  Lower Extremity US (DVT) 02/28/2017 No evidence of deep vein or superficial thrombosis involving the right lower extremity and left lower extremity.  TEE 03/02/2017 Left Ventrical:LVEF 60-65% Mitral Valve:No MR Aortic Valve:No AI Tricuspid Valve: Mild TR Pulmonic Valve: No PR Left Atrium/ Left atrial appendage: No thrombus, normal emptying and filling velocities. Atrial septum:Hypermobile, lipomatous hypertrophy of the interatrial septum. No PFO, negative bubble study. Aorta:Severe non-mobile atheroma in the descending thoracic aorta. Ascending aorta is dilated measuring 42-43 mm, a  dissection can't be excluded on current images, a dedicated chest CTA will be ordered, this was discussed with Dr Leonie Man, the patient and her daughter. They agree. She will be referred to CV surgery and to me for a follow up.   CT angio of chest 02/2017: 1. Aneurysmal dilatation of the ascending thoracic aorta, measuring up to 4.2 cm. No evidence of dissection.Recommend annual imaging followup by CTA or MRA. This recommendation follows 2010 ACCF/AHA/AATS/ACR/ASA/SCA/SCAI/SIR/STS/SVM Guidelines for the Diagnosis and Management of Patients with Thoracic Aortic Disease. Circulation. 2010; 121: C144-Y185 2. Round 8 mm calcified lesion in the right aspect of the spinal canal at the level of T8, possibly a meningioma. Consider further evaluation with nonemergent/outpatient contrast-enhanced MRI of the thoracic spine.   Assessment/Plan:  1. PAF - she is in NSR by exam today. She remains on anticoagulation.  No recurrence that she is aware of.   2. Swelling - she is on CCB therapy - I suspect this is the culprit - BP is soft today - she will monitor and discuss with PCP - would favor cutting in half or changing to ACE.  Already restricting her salt.   3. Aortic atherosclerosis - managed with risk factor modification.   4. Ascending aortic aneurysm - for repeat scan and visit next month with Dr. Darcey Nora  5. Prior stroke - has recovered nicely.   6. High Risk Medicine - will get her labs from PCP  7. HLD - she is on statin - labs checked by PCP - will try to get copy of most recent labs.   Current medicines are reviewed with the patient today.  The patient does not have concerns regarding medicines other than what has been noted above.  The following changes have been made:  See above.  Labs/ tests ordered today include:   No orders of the defined types were placed in this encounter.    Disposition:   FU with me in 6 months.   Patient is agreeable to this plan and will call if  any problems develop in the interim.   SignedTruitt Merle, NP  02/03/2018 11:32 AM  [facility] [address] [place], [place]  [address] Phone: [phone] Fax: [phone]

## 2018-02-03 NOTE — Telephone Encounter (Signed)
Called Dr.Haque office's @ [phone] to get pt's  recent lab results. Office is closed everyday from 12:00 - 1:30 pm.

## 2018-02-08 NOTE — Telephone Encounter (Signed)
S/w Kim at Dr. Tenna Delaine office will fax pt's recent lab results to office.

## 2018-03-10 ENCOUNTER — Ambulatory Visit: Payer: Medicare HMO | Admitting: Cardiothoracic Surgery

## 2018-03-10 ENCOUNTER — Encounter: Payer: Self-pay | Admitting: Cardiothoracic Surgery

## 2018-03-10 ENCOUNTER — Other Ambulatory Visit: Payer: Self-pay

## 2018-03-10 ENCOUNTER — Ambulatory Visit
Admission: RE | Admit: 2018-03-10 | Discharge: 2018-03-10 | Disposition: A | Discharge status: Home / Self Care | Payer: Medicare HMO | Source: Ambulatory Visit | Attending: Cardiothoracic Surgery | Admitting: Cardiothoracic Surgery

## 2018-03-10 VITALS — BP 160/100 | HR 64 | Resp 16 | Ht 62.0 in | Wt 147.0 lb

## 2018-03-10 DIAGNOSIS — I712 Thoracic aortic aneurysm, without rupture, unspecified: Secondary | ICD-10-CM

## 2018-03-10 MED ORDER — IOPAMIDOL (ISOVUE-370) INJECTION 76%
75.0000 mL | Freq: Once | INTRAVENOUS | Status: AC | PRN
  Administered 2018-03-10: 75 mL via INTRAVENOUS

## 2018-03-10 NOTE — Progress Notes (Signed)
PCP is Haque, Imran P, MD Referring Provider is Gerhardt, Lori C, NP  Chief Complaint  Patient presents with  . TAA    5 month f/u with CTA CHEST...1 YR from initial scan    HPI: Annual visit with CTA of thoracic aorta for an asymptomatic 4.5 cm fusiform ascending aneurysm.  The patient is fairly small with BSA 1.7 and she has a moderate to large aneurysm.  This was first noted 1 year ago when she had a stroke from probable A. fib.  It measured 4.2 cm at that time.  Patient has hypertension.  Recently her antihypertensive medication was reduced due to soft blood pressure and ankle swelling-the amlodipine was stopped.  Her Norman was increased to 25 mg daily.  Today her blood pressure is 150/100.  She is somewhat anxious.  Her CT scan shows some enlargement of the ascending aorta from 4.2 up to 4.4-4.5 cm.  There is no hematoma or ulceration.  We discussed the importance of blood pressure control to prevent the need for thoracic aortic replacement and the risk of aortic dissection.  Her last echo showed a trileaflet aortic valve, no significant left ear or AI and good LV function.  I believe that if  her blood pressure is well controlled that her risk of needing surgery will be minimal.  She needs to have her blood pressure checked by her primary care doctor within 48 hours and then I have recommended that she check her pressure at home with a digital kit and record the data for her caregivers.   Past Medical History:  Diagnosis Date  . Cancer (HCC)   . Hypertension   . Parathyroid abnormality (HCC) 2004  . Stroke (cerebrum) (HCC) 02/25/2017    Past Surgical History:  Procedure Laterality Date  . MASTECTOMY Right   . TEE WITHOUT CARDIOVERSION N/A 03/02/2017   Procedure: TRANSESOPHAGEAL ECHOCARDIOGRAM (TEE);  Surgeon: Nelson, Katarina H, MD;  Location: [facility];  Service: Cardiovascular;  Laterality: N/A;    Family History  Problem Relation Age of Onset  . High blood pressure  Mother   . High blood pressure Father   . Congestive Heart Failure Father   . Breast cancer Sister   . Hyperthyroidism Sister   . Heart attack Brother 64  . High blood pressure Daughter        age 54    Social History Social History   Tobacco Use  . Smoking status: Never Smoker  . Smokeless tobacco: Never Used  Substance Use Topics  . Alcohol use: No    Comment: uta  . Drug use: No    Current Outpatient Medications  Medication Sig Dispense Refill  . apixaban (ELIQUIS) 5 MG TABS tablet Take 1 tablet (5 mg total) by mouth 2 (two) times daily. 180 tablet 3  . atenolol (TENORMIN) 25 MG tablet Take 0.5 tablets (12.5 mg total) by mouth daily. 15 tablet 0  . atorvastatin (LIPITOR) 20 MG tablet Take 1 tablet (20 mg total) by mouth daily at 6 PM. 30 tablet 0   No current facility-administered medications for this visit.     No Known Allergies  Review of Systems  No unusual stress or trauma recently that would increase her blood pressure No history of salty meals or fast food recently which would increase her blood pressure  BP (!) 160/100 (BP Location: Left Arm, Patient Position: Sitting, Cuff Size: Normal) Comment (Cuff Size): MANUALLY  Pulse 64   Resp 16   Ht 5' 2" (  1.575 m)   Wt 147 lb (66.7 kg)   LMP  (LMP Unknown)   SpO2 97%   BMI 26.89 kg/m  Physical Exam Alert and comfortable no distress No JVD no cervical adenopathy Breath sounds clear Heart rate regular without murmur Abdomen soft Pedal edema trace No neuro deficit  Diagnostic Tests: CTA images personally reviewed and counseled with patient showing slight interval increase in the ascending aortic diameter from 4.2 up to 4.5 cm.  Associated with that is a increase in her blood pressure.  Impression: Consider adding ARB or ACE inhibitor but will leave that to her primary care physician after further blood pressure determinations are made.  Because of the interval increase in her CT scan we will reimage her in 6  months rather than a year.  She will report any chest pain that develops in the interim.   Plan: Return in 6 months with CTA of thoracic aorta.   Len Childs, MD [facility] [phone]

## 2018-03-15 ENCOUNTER — Telehealth: Payer: Self-pay | Admitting: *Deleted

## 2018-03-15 NOTE — Telephone Encounter (Signed)
lvm to check on pt's bp.

## 2018-03-16 ENCOUNTER — Telehealth: Payer: Self-pay | Admitting: *Deleted

## 2018-03-16 NOTE — Telephone Encounter (Signed)
See Lori's last note, want's to know how pt's bp is doing.

## 2018-03-16 NOTE — Telephone Encounter (Signed)
-----   Message from [name], NP sent at 03/11/2018  7:58 AM EDT ----- Let's call her in the next few days and check on her BP please.   Zoe Andrade ----- Message ----- From: Ivin Poot, MD Sent: 03/10/2018   5:47 PM EDT To: [name], NP

## 2018-03-17 NOTE — Telephone Encounter (Signed)
lvm to discuss bp per Truitt Merle, NP.

## 2018-03-17 NOTE — Telephone Encounter (Signed)
-----   Message from [name], NP sent at 03/11/2018  7:58 AM EDT ----- Let's call her in the next few days and check on her BP please.   lori ----- Message ----- From: Ivin Poot, MD Sent: 03/10/2018   5:47 PM EDT To: [name], NP

## 2018-03-29 NOTE — Telephone Encounter (Signed)
Her Plendil was stopped due to previously low BP and swelling.   Let's see what her current dose of Accupril is - I am happy to see her back to help regulate.

## 2018-03-29 NOTE — Telephone Encounter (Signed)
-----   Message from [name], NP sent at 03/11/2018  7:58 AM EDT ----- Let's call her in the next few days and check on her BP please.   Zoe Andrade ----- Message ----- From: Ivin Poot, MD Sent: 03/10/2018   5:47 PM EDT To: [name], NP

## 2018-03-29 NOTE — Telephone Encounter (Signed)
Follow Up:; ° ° °Returning your call. °

## 2018-03-29 NOTE — Telephone Encounter (Signed)
S/w pt stated ever since Dr.Haqu took pt off of Plendil bp has not been the same stated after being off the Plendil pt's bp was 188/108.  PCP was seen Friday and put was pt on acupril (5 mg) tablet, was told to take 3 tablets Friday, sat, and Sunday and PCP office was to send in a (20 mg ) tablet today.  Pt's bp 154/99.  Pt Called Dr. Wyline Copas office today to see if tablet was called in or should pt take 4 tablets today.  Stated would call pt tomorrow to see outcome of medication and to see what bp was doing and than advise with Cecille Rubin. Pt has appt with Cecille Rubin in February.  Will send to [place] to [place].

## 2018-03-29 NOTE — Telephone Encounter (Signed)
lvm to check on pt's bp, pt is to call back the office with update.

## 2018-03-29 NOTE — Telephone Encounter (Signed)
lvm to get update on pt's bp.

## 2018-03-30 ENCOUNTER — Telehealth: Payer: Self-pay | Admitting: Nurse Practitioner

## 2018-03-30 DIAGNOSIS — I10 Essential (primary) hypertension: Secondary | ICD-10-CM

## 2018-03-30 NOTE — Telephone Encounter (Signed)
Pt calling in today due to bp issues.  Dr. Jannette Fogo is not getting back to pt.  Pt increased today per Dr.Haque office Acupril to 4 tablets (20mg ) daily. Pt stated bp today with increased dose is 166/101.  Pt will come in on Friday, oct 18 with bp cuff and readings to see Cecille Rubin. Pt is agreeable to treatment plan.

## 2018-03-30 NOTE — Telephone Encounter (Signed)
New Message ° ° ° ° ° ° ° ° ° °Patient returned your call, would like a call back. °

## 2018-04-01 ENCOUNTER — Encounter: Payer: Self-pay | Admitting: Nurse Practitioner

## 2018-04-02 ENCOUNTER — Encounter: Payer: Self-pay | Admitting: Nurse Practitioner

## 2018-04-02 ENCOUNTER — Ambulatory Visit: Payer: Medicare HMO | Admitting: Nurse Practitioner

## 2018-04-02 VITALS — BP 158/100 | HR 64 | Ht 62.0 in | Wt 149.1 lb

## 2018-04-02 DIAGNOSIS — I700 Atherosclerosis of aorta: Secondary | ICD-10-CM

## 2018-04-02 DIAGNOSIS — Z79899 Other long term (current) drug therapy: Secondary | ICD-10-CM

## 2018-04-02 DIAGNOSIS — I712 Thoracic aortic aneurysm, without rupture: Secondary | ICD-10-CM

## 2018-04-02 DIAGNOSIS — I7121 Aneurysm of the ascending aorta, without rupture: Secondary | ICD-10-CM

## 2018-04-02 DIAGNOSIS — I480 Paroxysmal atrial fibrillation: Secondary | ICD-10-CM

## 2018-04-02 DIAGNOSIS — I10 Essential (primary) hypertension: Secondary | ICD-10-CM

## 2018-04-02 DIAGNOSIS — Z7901 Long term (current) use of anticoagulants: Secondary | ICD-10-CM

## 2018-04-02 LAB — BASIC METABOLIC PANEL
BUN/Creatinine Ratio: 15 (ref 12–28)
BUN: 11 mg/dL (ref 8–27)
CO2: 21 mmol/L (ref 20–29)
Calcium: 9.5 mg/dL (ref 8.7–10.3)
Chloride: 102 mmol/L (ref 96–106)
Creatinine, Ser: 0.74 mg/dL (ref 0.57–1.00)
GFR calc Af Amer: 90 mL/min/{1.73_m2} (ref >59)
GFR calc non Af Amer: 78 mL/min/{1.73_m2} (ref >59)
Glucose: 85 mg/dL (ref 65–99)
Potassium: 4.3 mmol/L (ref 3.5–5.2)
Sodium: 141 mmol/L (ref 134–144)

## 2018-04-02 MED ORDER — AMLODIPINE BESYLATE 5 MG PO TABS: 5.0000 mg | ORAL_TABLET | Freq: Every day | ORAL | 6 refills | Status: DC

## 2018-04-02 NOTE — Progress Notes (Signed)
CARDIOLOGY OFFICE NOTE  Date:  04/02/2018    Zoe Andrade Date of Birth: 03-Jul-1940 Medical Record #[id number]  PCP:  Raelyn Number, MD  Cardiologist:  Marisa Cyphers    Chief Complaint  Patient presents with  . Hypertension    Work in visit - seen for Dr. Marlou Porch    History of Present Illness: Zoe Andrade is a 77 y.o. female who presents today for a work in visit. Seen for Dr. Marlou Porch.   She was seen here back in October of 2018 forevaluation of paroxysmal atrial fibrillation at the request of Dr.Sethi. [facility] palpitations and sweating and was diagnosed with new onset atrial fibrillation. She was started on both Eliquis and aspirin. It was recommended that she discontinue the aspirin and stay on Eliquis alone.  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 There was also an incidental finding of potential meningioma at T9.  I saw her back in February - she was pretty tearful - husband had just died - was otherwise doing ok. She wanted referral to [facility]. She was changing primary care doctors.   I last saw her in August - she was doing quite well. Her BP was soft. She is now followed by [facility] for her thoracic aneurysm.   Several phone calls lately - her Plendil was stopped. She was on 10 mg total. She had swelling in her legs that did resolve with stopping the CCB. BP has become quite elevated. She has been started on ACE and had her dose of beta blocker increased.   Comes in today. Here alone. She is a little frustrated. Over the course of the week, she has had her dose of Atenolol increased to 25 mg. She has been started on 5 mg of ACE and quickly escalated to 20 mg BID. She is worried about stroke. She has had some headache. She otherwise feels ok. Her cuff does run a little higher. Does not sound like she uses that much salt. BP was high when she saw [facility] last month.   Past Medical History:  Diagnosis  Date  . Cancer ([place])   . Hypertension   . 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 MASTECTOMY Right   . TEE WITHOUT CARDIOVERSION N/A 03/02/2017   Procedure: TRANSESOPHAGEAL ECHOCARDIOGRAM (TEE);  Surgeon: Dorothy Spark, MD;  Location: [facility] ENDOSCOPY;  Service: Cardiovascular;  Laterality: N/A;     Medications: Current Meds  Medication Sig  . apixaban (ELIQUIS) 5 MG TABS tablet Take 1 tablet (5 mg total) by mouth 2 (two) times daily.  Marland Kitchen atenolol (TENORMIN) 25 MG tablet Take 25 mg by mouth daily.  Marland Kitchen atorvastatin (LIPITOR) 20 MG tablet Take 1 tablet (20 mg total) by mouth daily at 6 PM.  . quinapril (ACCUPRIL) 20 MG tablet Take 20 mg by mouth 2 (two) times daily.     Allergies: No Known Allergies  Social History: The patient  reports that she has never smoked. She has never used smokeless tobacco. She reports that she does not drink alcohol or use drugs.   Family History: The patient's family history includes Breast cancer in her sister; Congestive Heart Failure in her father; Heart attack (age of onset: 24) in her brother; High blood pressure in her daughter, father, and mother; Hyperthyroidism in her sister.   Review of  Systems: Please see the history of present illness.   Otherwise, the review of systems is positive for none.   All other systems are reviewed and negative.   Physical Exam: VS:  BP (!) 158/100 (BP Location: Left Arm, Patient Position: Sitting, Cuff Size: Normal)   Pulse 64   Ht 5\' 2"  (1.575 m)   Wt 149 lb 1.9 oz (67.6 kg)   LMP  (LMP Unknown)   BMI 27.27 kg/m  .  BMI Body mass index is 27.27 kg/m.  Wt Readings from Last 3 Encounters:  04/02/18 149 lb 1.9 oz (67.6 kg)  03/10/18 147 lb (66.7 kg)  02/03/18 147 lb (66.7 kg)   Her cuff is 174/117  General: Pleasant. Well developed, well nourished and in no acute distress.   HEENT: Normal.  Neck: Supple, no JVD,  carotid bruits, or masses noted.  Cardiac: Regular rate and rhythm. No murmurs, rubs, or gallops. No edema.  Respiratory:  Lungs are clear to auscultation bilaterally with normal work of breathing.  GI: Soft and nontender.  MS: No deformity or atrophy. Gait and ROM intact.  Skin: Warm and dry. Color is normal.  Neuro:  Strength and sensation are intact and no gross focal deficits noted.  Psych: Alert, appropriate and with normal affect.   LABORATORY DATA:  EKG:  EKG is not ordered today.  Lab Results  Component Value Date   WBC 8.1 03/02/2017   HGB 13.0 03/02/2017   HCT 38.2 03/02/2017   PLT 164 03/02/2017   GLUCOSE 107 (H) 03/02/2017   CHOL 186 02/26/2017   TRIG 101 02/26/2017   HDL 51 02/26/2017   LDLCALC 115 (H) 02/26/2017   ALT 21 02/25/2017   AST 23 02/25/2017   NA 138 03/02/2017   K 3.7 03/02/2017   CL 109 03/02/2017   CREATININE 0.71 03/02/2017   BUN 11 03/02/2017   CO2 25 03/02/2017   INR 0.92 02/25/2017   HGBA1C 5.3 02/26/2017     BNP (last 3 results) No results for input(s): BNP in the last 8760 hours.  ProBNP (last 3 results) No results for input(s): PROBNP in the last 8760 hours.   Other Studies Reviewed Today:  TTE9/2018 - Left ventricle: The cavity size was normal. Systolic function was normal. The estimated ejection fraction was in the range of 60%to 65%. Wall motion was normal; there were no regional wallmotion abnormalities. Doppler parameters are consistent with abnormal left ventricular relaxation (grade 1 diastolic dysfunction). - Left atrium: The atrium was mildly dilated. Anterior-posterior dimension: 41 mm. Impressions: - No cardiac source of emboli was indentified.  Lower Extremity US (DVT) 02/28/2017 No evidence of deep vein or superficial thrombosis involving the right lower extremity and left lower extremity.  TEE 03/02/2017 Left Ventrical:LVEF 60-65% Mitral Valve:No MR Aortic Valve:No AI Tricuspid Valve: Mild  TR Pulmonic Valve: No PR Left Atrium/ Left atrial appendage: No thrombus, normal emptying and filling velocities. Atrial septum:Hypermobile, lipomatous hypertrophy of the interatrial septum. No PFO, negative bubble study. Aorta:Severe non-mobile atheroma in the descending thoracic aorta. Ascending aorta is dilated measuring 42-43 mm, a dissection can't be excluded on current images, a dedicated chest CTA will be ordered, this was discussed with Dr Leonie Man, the patient and her daughter. They agree. She will be referred to CV surgery and to me for a follow up.   CT angio of chest9/2018: 1. Aneurysmal dilatation of the ascending thoracic aorta, measuring up to 4.2 cm. No evidence of dissection.Recommend annual imaging followup by CTA or MRA.  This recommendation follows 2010 ACCF/AHA/AATS/ACR/ASA/SCA/SCAI/SIR/STS/SVM Guidelines for the Diagnosis and Management of Patients with Thoracic Aortic Disease. Circulation. 2010; 121: Z124-P809 2. Round 8 mm calcified lesion in the right aspect of the spinal canal at the level of T8, possibly a meningioma. Consider further evaluation with nonemergent/outpatient contrast-enhanced MRI of the thoracic spine.   Assessment/Plan:  1. HTN - BP remains high - she had swelling on high dose CCB. Recheck by me is 160/100 in both arms. I am adding Norvasc 5 mg to take in the evening. She is to monitor her BP closely over the course of the weekend - we may end up being able to cut some of the ACE back. Her cuff does run about 15 points higher. She has been instructed to hold the evening dose of her ACE if BP is less than 140/90 this weekend. BMET today.   2. PAF -she is in NSR by exam today. She remains on anticoagulation.   3. Swelling - this has resolved - hopefully she will be able to tolerate the 5 mg of Norvasc. She is to restrict salt.   4. Aortic atherosclerosis- managed with risk factor modification.   5. Ascending aortic aneurysm- followed by  Dr. Darcey Nora  6. Prior stroke - has recovered nicely. Still needs good BP control.   7. HLD- on statin.    Current medicines are reviewed with the patient today.  The patient does not have concerns regarding medicines other than what has been noted above.  The following changes have been made:  See above.  Labs/ tests ordered today include:    Orders Placed This Encounter  Procedures  . Basic metabolic panel     Disposition:   FU with me as planned.    Patient is agreeable to this plan and will call if any problems develop in the interim.   SignedTruitt Merle, NP  04/02/2018 11:16 AM  [place] [address] [place], [place]  [address] Phone: [phone] Fax: [phone]

## 2018-04-02 NOTE — Patient Instructions (Addendum)
We will be checking the following labs today - BMET  If you have labs (blood work) drawn today and your tests are completely normal, you will receive your results only by: Marland Kitchen MyChart Message (if you have MyChart) OR . A paper copy in the mail If you have any lab test that is abnormal or we need to change your treatment, we will call you to review the results.   Medication Instructions:    Continue with your current medicines. BUT  I am adding Norvasc 5 mg to take each evening - start tonight   If you need a refill on your cardiac medications before your next appointment, please call your pharmacy.     Testing/Procedures To Be Arranged:  N/A  Follow-Up:   See me as planned  Call here on Monday    At [facility], you and your health needs are our priority.  As part of our continuing mission to provide you with exceptional heart care, we have created designated Provider Care Teams.  These Care Teams include your primary Cardiologist (physician) and Advanced Practice Providers (APPs -  Physician Assistants and Nurse Practitioners) who all work together to provide you with the care you need, when you need it.  Special Instructions:  . I want you to monitor your BP twice a day - if you see your BP is running 140/90 or less during the day - then do NOT take the evening dose of the Accupril.   Call the [place] office at [phone] if you have any questions, problems or concerns.

## 2018-04-05 ENCOUNTER — Telehealth: Payer: Self-pay | Admitting: Nurse Practitioner

## 2018-04-05 NOTE — Telephone Encounter (Signed)
Phone call received today. Readings are:  159/85 with HR 98 171/100 with HR 65 158/90 with HR 68 166/106 with HR 67 166/101 with HR 61.   Her cuff did read about 15 points higher when she was here on Friday. She has has been placed on escalating doses of ACE (now 20mg  BID) and Norvasc 5 in the past 7 days.   I have asked her to continue to monitor til Wednesday - if her readings remain elevated - proceed with renal duplex, start HCTZ 25 mg a day. BMET in one week.   [name], RN, [place] [address] [place], [place]  [address] [phone]

## 2018-04-08 NOTE — Telephone Encounter (Signed)
Reviewed for Zoe Merle, NP  BP closer to target (< 130/80). Diastolic readings would likely respond better to a thiazide diuretic. PLAN:  1. Start HCTZ 12.5 mg QD  2. BMET 1 week. (order under Cecille Rubin) Richardson Dopp, PA-C    04/08/2018 11:47 AM

## 2018-04-08 NOTE — Telephone Encounter (Signed)
Pt called in yesterday, did not see phone call till this am.  Pt was calling to report bp readings, bp readings as follows:  Oct 22  142/93  HR 72 PM        143/99  HR 67  Oct 23  143/99  HR 67 PM        131/90  HR 70  Oct 24  166/97  HR 77 PM         142/96  HR 67 PM         136/86  HR 65  No side effects from the amlodipine.  Will send to [facility] to review.

## 2018-04-13 ENCOUNTER — Other Ambulatory Visit: Payer: Self-pay | Admitting: *Deleted

## 2018-04-13 DIAGNOSIS — I10 Essential (primary) hypertension: Secondary | ICD-10-CM

## 2018-04-13 MED ORDER — QUINAPRIL HCL 20 MG PO TABS: 20.0000 mg | ORAL_TABLET | Freq: Two times a day (BID) | ORAL | 9 refills | Status: DC

## 2018-04-13 MED ORDER — HYDROCHLOROTHIAZIDE 12.5 MG PO CAPS: 12.5000 mg | ORAL_CAPSULE | Freq: Every day | ORAL | 9 refills | Status: DC

## 2018-04-13 NOTE — Progress Notes (Signed)
error 

## 2018-04-13 NOTE — Telephone Encounter (Signed)
We have spoken to patient's daughter who notes that her mom really just wants to go back to her regular hypertensive medicine - this would be 12.5 mg of Atenolol and 10 mg of Plendil.   Plendil caused significant swelling.   Over the past 2 weeks - she has been started on ACE - now on 40 mg of Accupril, has had her Atenolol increased to 25 mg a day. We started 5 mg of Norvasc last week.   BP remains elevated. Her cuff does not correlate well - runs about 15 points higher.   She does not wish to add another medicine at this time. Per our visit we discussed possible renal duplex.   I have asked her to hold HCTZ.  Continue with her current regimen.  We will obtain renal duplex to rule out RAS She is to obtain a new BP cuff.   She is agreeable to this plan.   [name], RN, [place] [address] [place], [place]  [phone]

## 2018-04-13 NOTE — Telephone Encounter (Signed)
Agree.   Zoe Andrade 

## 2018-04-13 NOTE — Telephone Encounter (Signed)
S/w pt is aware and agreeable to treatment plan.  Will start HCTZ one tablet by mouth (12.5 mg ) daily, sent into pt's requested pharmacy and also sent in accupril. Pt will come in on Nov 6 for repeat bmet. Will call if any problems.

## 2018-04-14 ENCOUNTER — Telehealth: Payer: Self-pay | Admitting: *Deleted

## 2018-04-14 NOTE — Telephone Encounter (Signed)
Per Lori's phone conversation, lvm with instructions and time for renal duplex.  Went over insurance form.  Will mail a copy to pt.  If pt has any problems to call office.

## 2018-04-14 NOTE — Telephone Encounter (Signed)
S/w pt went over all test instructions and location.  Put a copy in the mail with instructions.

## 2018-04-21 ENCOUNTER — Other Ambulatory Visit: Payer: Medicare HMO

## 2018-04-23 ENCOUNTER — Ambulatory Visit (HOSPITAL_COMMUNITY)
Admission: RE | Admit: 2018-04-23 | Discharge: 2018-04-23 | Disposition: A | Discharge status: Home / Self Care | Payer: Medicare HMO | Source: Ambulatory Visit | Attending: Cardiology | Admitting: Cardiology

## 2018-04-23 DIAGNOSIS — I10 Essential (primary) hypertension: Secondary | ICD-10-CM | POA: Diagnosis present

## 2018-04-28 ENCOUNTER — Other Ambulatory Visit: Payer: Self-pay | Admitting: *Deleted

## 2018-04-28 MED ORDER — QUINAPRIL HCL 20 MG PO TABS: 20.0000 mg | ORAL_TABLET | Freq: Every day | ORAL | 9 refills | Status: DC

## 2018-04-28 NOTE — Progress Notes (Unsigned)
error 

## 2018-05-19 ENCOUNTER — Telehealth: Payer: Self-pay | Admitting: Nurse Practitioner

## 2018-05-19 NOTE — Telephone Encounter (Signed)
New Message          Zoe Andrade is calling from [facility] Internal medicine, she has a question about (Accupirl 20 mg) pls call and advise

## 2018-05-19 NOTE — Telephone Encounter (Signed)
Called Zoe Andrade at [facility] the phone was disconnected while on hold, called back twice and received vm left message.

## 2018-05-24 NOTE — Telephone Encounter (Signed)
Follow up   Zoe Andrade with [facility] is calling to check on the status of the medication change request  Pt c/o medication issue:  1. Name of Medication: quinapril (ACCUPRIL) 20 MG tablet   2. How are you currently taking this medication (dosage and times per day)? Take 1 tablet (20 mg total) by mouth daily.  3. Are you having a reaction (difficulty breathing--STAT)? Yes muscle pain and cough  4. What is your medication issue? Pt is wanting to change from the Accupril to an increase dose of her atenolol (TENORMIN) 25 MG tablet due to muscle pain and a cough. Please call

## 2018-05-24 NOTE — Telephone Encounter (Signed)
I will ask Danielle to call Ms. Copen. I would not increase the Atenolol due to her heart rate in the 60's - this could potentially get worse with increased dose of the Atenolol.   In regards to the cough - can change to Losartan - would start at 50 mg a day - this may need to be adjusted based on her BP response.   [name], RN, [place] [address] [place], [place]  [address] [phone]

## 2018-05-24 NOTE — Telephone Encounter (Signed)
Will forward to Truitt Merle NP to review and address ./cy

## 2018-05-25 ENCOUNTER — Other Ambulatory Visit: Payer: Self-pay | Admitting: Nurse Practitioner

## 2018-05-25 ENCOUNTER — Other Ambulatory Visit: Payer: Self-pay | Admitting: Pharmacist Clinician (PhC)/ Clinical Pharmacy Specialist

## 2018-05-25 MED ORDER — ATENOLOL 25 MG PO TABS: 25.0000 mg | ORAL_TABLET | Freq: Every day | ORAL | 1 refills | Status: DC

## 2018-05-25 NOTE — Telephone Encounter (Signed)
See previous phone encounter.

## 2018-05-25 NOTE — Telephone Encounter (Signed)
Returned call back to [employer] with [facility].  Spoke with Lars Mage and Myrtie Soman has already left for the call and she will have her call back tomorrow.

## 2018-05-25 NOTE — Telephone Encounter (Signed)
Follow up;   Patient nurse calleds yesterday to speak to a nurse concerning a refill for a patient and to stop a medication no one return her call back please call Tylena. [phone] or ask for Kim.

## 2018-05-25 NOTE — Telephone Encounter (Signed)
Left message for pt to call back re message below.

## 2018-05-26 NOTE — Telephone Encounter (Signed)
Tylena from [facility] called back. She has been made aware that Cecille Rubin don't want to increase the Atenolol due to concerns of pt's heart rate, but will stop the Accupril and start Losartan 50 mg qd. She has been made aware that a message has been left for the pt and will await for pt to call back before med changes have been made in the computer. She verbalized understanding and advised that she would try to contact pt as well.

## 2018-06-01 NOTE — Telephone Encounter (Signed)
lvm for pt to call office to update on medication changes.

## 2018-06-03 ENCOUNTER — Other Ambulatory Visit: Payer: Self-pay | Admitting: *Deleted

## 2018-06-03 NOTE — Telephone Encounter (Signed)
Noted.   Will stay with her current regimen for now.

## 2018-06-03 NOTE — Telephone Encounter (Signed)
S/w pt due to having medication issues and Dr.Hauqe's  office calling.  Pt did stop acupril, medication list updated and leg pain and headaches subsided. At this time pt does not feel pt needs to start Losartan, bp readings look ok and are as follows:  12/15  AM  125/79  HR  63  PM  152/98  HR  71  12/16  AM  141/85  HR  68            PM  146/93  HR  70  12/18  AM  139/87  HR  68  Stated would send to Truitt Merle, NP to advise and will call back pt ONLY if Cecille Rubin would like pt to start Losartan.

## 2018-07-15 ENCOUNTER — Other Ambulatory Visit: Payer: Self-pay | Admitting: Cardiothoracic Surgery

## 2018-07-15 DIAGNOSIS — I712 Thoracic aortic aneurysm, without rupture, unspecified: Secondary | ICD-10-CM

## 2018-08-04 ENCOUNTER — Encounter: Payer: Self-pay | Admitting: Nurse Practitioner

## 2018-08-04 ENCOUNTER — Ambulatory Visit: Payer: Medicare HMO | Admitting: Nurse Practitioner

## 2018-08-04 VITALS — BP 122/82 | HR 63 | Ht 62.0 in | Wt 154.4 lb

## 2018-08-04 DIAGNOSIS — I10 Essential (primary) hypertension: Secondary | ICD-10-CM | POA: Diagnosis not present

## 2018-08-04 DIAGNOSIS — I7121 Aneurysm of the ascending aorta, without rupture: Secondary | ICD-10-CM

## 2018-08-04 DIAGNOSIS — I712 Thoracic aortic aneurysm, without rupture: Secondary | ICD-10-CM | POA: Diagnosis not present

## 2018-08-04 DIAGNOSIS — I480 Paroxysmal atrial fibrillation: Secondary | ICD-10-CM

## 2018-08-04 MED ORDER — FELODIPINE ER 10 MG PO TB24: 10.0000 mg | ORAL_TABLET | Freq: Every day | ORAL | 3 refills | Status: DC

## 2018-08-04 NOTE — Progress Notes (Signed)
CARDIOLOGY OFFICE NOTE  Date:  08/04/2018    Zoe Andrade Date of Birth: [birth year] Medical Record #[id number]  PCP:  Zoe Number, MD  Cardiologist:  Zoe Andrade    Chief Complaint  Patient presents with  . Hypertension    Follow up visit. Seen for Dr. Marlou Andrade    History of Present Illness: Zoe Andrade is a 78 y.o. female who presents today for a follow up visit. Seen for Dr. Marlou Andrade.   She was seen here back in [place] 205forevaluation of paroxysmal atrial fibrillation at the request of Zoe Andrade. [facility] and was diagnosed with new onset atrial fibrillation. She was started on both Eliquis and aspirin. It was recommended that she discontinue the aspirin and stay on Eliquis alone.  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 There was also an incidental finding of potential meningioma at T9. She has been referred over to [facility].   Last seen in October - had had lots of issues with her BP and associated medicines. Her Plendil had been stopped due to swelling. She ended up requiring multiple agents. Had renal doppler as well - this has shown 1 to 59% narrowing on the right. Her cuff has been noted to run a little higher.   Comes in today. Here alone. She is now back off all her prior medicines - back on Plendil by her PCP. She says her walking "got so bad she was almost falling" BP is now fairly acceptable. She is asking for refills. Her walking is improved. Her legs still hurt some - but sounds like it is getting better. She uses some Tylenol with relief. No chest pain. Rhythm ok. She has not been as active due to the weather and all the rain. She is trying to get her affairs in order - has been talking with her kids, etc. She does not have a health care POA.   Past Medical History:  Diagnosis Date  . Cancer ([place])   . Hypertension   . 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 MASTECTOMY Right   . TEE WITHOUT CARDIOVERSION N/A 03/02/2017   Procedure: TRANSESOPHAGEAL ECHOCARDIOGRAM (TEE);  Surgeon: Zoe Spark, MD;  Location: [facility] ENDOSCOPY;  Service: Cardiovascular;  Laterality: N/A;     Medications: Current Meds  Medication Sig  . apixaban (ELIQUIS) 5 MG TABS tablet Take 1 tablet (5 mg total) by mouth 2 (two) times daily.  Marland Kitchen atenolol (TENORMIN) 25 MG tablet Take 1 tablet (25 mg total) by mouth daily.  . cholecalciferol (VITAMIN D3) 25 MCG (1000 UT) tablet Take 2,000 Units by mouth daily.  . felodipine (PLENDIL) 10 MG 24 hr tablet Take 1 tablet (10 mg total) by mouth daily.  . Omega-3 Fatty Acids (FISH OIL) 1000 MG CAPS Take 2,000 mg by mouth daily.  . [DISCONTINUED] felodipine (PLENDIL) 10 MG 24 hr tablet Take 10 mg by mouth daily.      Allergies: No Known Allergies  Social History: The patient  reports that she has never smoked. She has never used smokeless tobacco. She reports that she does not drink alcohol or use drugs.   Family History: The patient's family history includes Breast cancer in her sister; Congestive Heart Failure in her father; Heart attack (age of onset: 78) in her brother; High blood  pressure in her daughter, father, and mother; Hyperthyroidism in her sister.   Review of Systems: Please see the history of present illness.   Otherwise, the review of systems is positive for none.   All other systems are reviewed and negative.   Physical Exam: VS:  BP 122/82 (BP Location: Left Arm, Patient Position: Sitting, Cuff Size: Normal)   Pulse 63   Ht 5\' 2"  (1.575 m)   Wt 154 lb 6.4 oz (70 kg)   LMP  (LMP Unknown)   BMI 28.24 kg/m  .  BMI Body mass index is 28.24 kg/m.  Wt Readings from Last 3 Encounters:  08/04/18 154 lb 6.4 oz (70 kg)  04/02/18 149 lb 1.9 oz (67.6 kg)  03/10/18 147 lb (66.7 kg)    General: Pleasant. Well  developed, well nourished and in no acute distress.   HEENT: Normal.  Neck: Supple, no JVD, carotid bruits, or masses noted.  Cardiac: Regular rate and rhythm. No murmurs, rubs, or gallops. No edema.  Respiratory:  Lungs are clear to auscultation bilaterally with normal work of breathing.  GI: Soft and nontender.  MS: No deformity or atrophy. Gait and ROM intact.  Skin: Warm and dry. Color is normal.  Neuro:  Strength and sensation are intact and no gross focal deficits noted.  Psych: Alert, appropriate and with normal affect.   LABORATORY DATA:  EKG:  EKG is ordered today. This demonstrates NSR.  Lab Results  Component Value Date   WBC 8.1 03/02/2017   HGB 13.0 03/02/2017   HCT 38.2 03/02/2017   PLT 164 03/02/2017   GLUCOSE 85 04/02/2018   CHOL 186 02/26/2017   TRIG 101 02/26/2017   HDL 51 02/26/2017   LDLCALC 115 (H) 02/26/2017   ALT 21 02/25/2017   AST 23 02/25/2017   NA 141 04/02/2018   K 4.3 04/02/2018   CL 102 04/02/2018   CREATININE 0.74 04/02/2018   BUN 11 04/02/2018   CO2 21 04/02/2018   INR 0.92 02/25/2017   HGBA1C 5.3 02/26/2017     BNP (last 3 results) No results for input(s): BNP in the last 8760 hours.  ProBNP (last 3 results) No results for input(s): PROBNP in the last 8760 hours.   Other Studies Reviewed Today:  Renal Duplex 04/2018:   Right: Normal size right kidney. Normal right Resistive Index.        Normal cortical thickness of right kidney. 1-59% stenosis of        the right renal artery. RRV flow present. 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 Left:  Normal size of left kidney. Normal left Resistive Index.        Normal cortical thickness of the left kidney. No evidence of        left renal artery stenosis. LRV flow present. Mesenteric: Normal Celiac artery and Superior Mesenteric artery findings.   Patent IVC.     TTE9/2018 - Left ventricle: The cavity size was normal. Systolic function was  normal. The estimated ejection fraction was in the range of 60%to 65%. Wall motion was normal; there were no regional wallmotion abnormalities. Doppler parameters are consistent with abnormal left ventricular relaxation (grade 1 diastolic dysfunction). - Left atrium: The atrium was mildly dilated. Anterior-posterior dimension: 41 mm. Impressions: - No cardiac source of emboli was indentified.  Lower Extremity US (DVT) 02/28/2017 No evidence of deep vein or superficial thrombosis involving the right lower  extremity and left lower extremity.  TEE 03/02/2017 Left Ventrical:LVEF 60-65% Mitral Valve:No MR Aortic Valve:No AI Tricuspid Valve: Mild TR Pulmonic Valve: No PR Left Atrium/ Left atrial appendage: No thrombus, normal emptying and filling velocities. Atrial septum:Hypermobile, lipomatous hypertrophy of the interatrial septum. No PFO, negative bubble study. Aorta:Severe non-mobile atheroma in the descending thoracic aorta. Ascending aorta is dilated measuring 42-43 mm, a dissection can't be excluded on current images, a dedicated chest CTA will be ordered, this was discussed with Dr Leonie Man, the patient and her daughter. They agree. She will be referred to CV surgery and to me for a follow up.   CT angio of chest9/2018:  1. Aneurysmal dilatation of the ascending thoracic aorta, measuring up to 4.2 cm. No evidence of dissection.Recommend annual imaging followup by CTA or MRA. This recommendation follows 2010 ACCF/AHA/AATS/ACR/ASA/SCA/SCAI/SIR/STS/SVM Guidelines for the Diagnosis and Management of Patients with Thoracic Aortic Disease. Circulation. 2010; 121: J628-Z662 2. Round 8 mm calcified lesion in the right aspect of the spinal canal at the level of T8, possibly a meningioma. Consider further evaluation with nonemergent/outpatient contrast-enhanced MRI of the thoracic spine.   Assessment/Plan:  1. HTN - BP looks great. Unclear to me why she has had all this  fluctuation and lability and required so many agents. BP is fine now. No changes made. Fortunately, no swelling with the Plendil - she will use support stockings and continue salt restriction.   2. PAF -she is in NSR by exam and EKG today. She remains on anticoagulation. Her labs are followed by her PCP. No problems noted.   3. Aortic atherosclerosis-managed with risk factor modification.  4.Ascending aortic aneurysm-followed by Dr. Darcey Nora - having repeat scan soon.   5. Prior stroke - has recovered nicely.   7. HLD-on statin.Her labs are checked by her PCP.   Current medicines are reviewed with the patient today.  The patient does not have concerns regarding medicines other than what has been noted above.  The following changes have been made:  See above.  Labs/ tests ordered today include:    Orders Placed This Encounter  Procedures  . EKG 12-Lead     Disposition:   FU with Dr. Marlou Andrade in 6 months.   Patient is agreeable to this plan and will call if any problems develop in the interim.   SignedTruitt Merle, NP  08/04/2018 11:29 AM  [employer] [address] [place], [place]  [address] Phone: [phone] Fax: [phone]

## 2018-08-04 NOTE — Patient Instructions (Addendum)
We will be checking the following labs today - NONE   Medication Instructions:    Continue with your current medicines.    If you need a refill on your cardiac medications before your next appointment, please call your pharmacy.     Testing/Procedures To Be Arranged:  N/A  Follow-Up:   See Dr. Marlou Porch in 6 months   At [facility], you and your health needs are our priority.  As part of our continuing mission to provide you with exceptional heart care, we have created designated Provider Care Teams.  These Care Teams include your primary Cardiologist (physician) and Advanced Practice Providers (APPs -  Physician Assistants and Nurse Practitioners) who all work together to provide you with the care you need, when you need it.  Special Instructions:  . [place] is what we talked about today.   Call the [place] office at [phone] if you have any questions, problems or concerns.

## 2018-08-30 ENCOUNTER — Other Ambulatory Visit: Payer: Self-pay | Admitting: Cardiology

## 2018-08-30 NOTE — Telephone Encounter (Signed)
Pt last saw Truitt Merle, NP on 08/04/18, last labs 04/02/18 Creat 0.74, age 78, weight 70kg, based on specified criteria pt is on appropriate dosage of Eliquis 5mg  BID.  Will refill rx.

## 2018-09-08 ENCOUNTER — Other Ambulatory Visit: Payer: Medicare HMO

## 2018-09-08 ENCOUNTER — Ambulatory Visit: Payer: Medicare HMO | Admitting: Cardiothoracic Surgery

## 2018-11-09 ENCOUNTER — Other Ambulatory Visit: Payer: Self-pay

## 2018-11-10 ENCOUNTER — Ambulatory Visit
Admission: RE | Admit: 2018-11-10 | Discharge: 2018-11-10 | Disposition: A | Discharge status: Home / Self Care | Payer: Medicare HMO | Source: Ambulatory Visit | Attending: Cardiothoracic Surgery | Admitting: Cardiothoracic Surgery

## 2018-11-10 ENCOUNTER — Encounter: Payer: Self-pay | Admitting: Cardiothoracic Surgery

## 2018-11-10 ENCOUNTER — Ambulatory Visit: Payer: Medicare HMO | Admitting: Cardiothoracic Surgery

## 2018-11-10 VITALS — BP 137/82 | HR 73 | Temp 98.4°F | Resp 16 | Ht 62.0 in | Wt 150.2 lb

## 2018-11-10 DIAGNOSIS — I712 Thoracic aortic aneurysm, without rupture, unspecified: Secondary | ICD-10-CM

## 2018-11-10 DIAGNOSIS — I10 Essential (primary) hypertension: Secondary | ICD-10-CM | POA: Diagnosis not present

## 2018-11-10 MED ORDER — IOPAMIDOL (ISOVUE-370) INJECTION 76%
75.0000 mL | Freq: Once | INTRAVENOUS | Status: AC | PRN
  Administered 2018-11-10: 75 mL via INTRAVENOUS

## 2018-11-10 NOTE — Progress Notes (Signed)
PCP is Raelyn Number, MD Referring Provider is [name], NP  Chief Complaint  Patient presents with  . TAA    6 month f/u with CTA CHEST today    HPI: Patient returns for routine follow-up exam with CTA of thoracic aorta for a 4.5 cm asymptomatic fusiform ascending aneurysm.  The aneurysm was noted as an incidental finding when she had an echocardiogram 18 months ago--EF was normal.  She has treated hypertension and PAF on Eliquis.  She denies chest pain or dyspnea with exertion.  She walks at least 30 minutes 3-4 times a week.  Today her CT scan shows no change in the the diameter of the fusiform ascending aneurysm stable at 4.5 cm.  There is no evidence of mural thickening or ulceration or hematoma.  Lung windows are clear. Her blood pressure today is satisfactory.  Her last blood pressure at cardiology was also satisfactory.  She takes her blood pressure at home once a week and keeps the data for her primary care physician.   Past Medical History:  Diagnosis Date  . Cancer ([place])   . Hypertension   . 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 MASTECTOMY Right   . TEE WITHOUT CARDIOVERSION N/A 03/02/2017   Procedure: TRANSESOPHAGEAL ECHOCARDIOGRAM (TEE);  Surgeon: Dorothy Spark, MD;  Location: [facility] ENDOSCOPY;  Service: Cardiovascular;  Laterality: N/A;    Family History  Problem Relation Age of Onset  . High blood pressure Mother   . High blood pressure Father   . Congestive Heart Failure Father   . Breast cancer Sister   . Hyperthyroidism Sister   . Heart attack Brother 49  . High blood pressure Daughter        age 34    Social History Social History   Tobacco Use  . Smoking status: Never Smoker  . Smokeless tobacco: Never Used  Substance Use Topics  . Alcohol use: No    Comment: uta  . Drug use: No    Current Outpatient Medications  Medication Sig Dispense Refill   . atenolol (TENORMIN) 25 MG tablet Take 1 tablet (25 mg total) by mouth daily. 90 tablet 1  . cholecalciferol (VITAMIN D3) 25 MCG (1000 UT) tablet Take 2,000 Units by mouth daily.    Marland Kitchen ELIQUIS 5 MG TABS tablet TAKE 1 TABLET TWICE A DAY 180 tablet 2  . felodipine (PLENDIL) 10 MG 24 hr tablet Take 1 tablet (10 mg total) by mouth daily. 90 tablet 3  . Omega-3 Fatty Acids (FISH OIL) 1000 MG CAPS Take 2,000 mg by mouth daily.     No current facility-administered medications for this visit.     No Known Allergies  Review of Systems   No weight change No dizziness syncope or falls Minimal stable ankle edema No chest pain No cough night sweats or fevers No difficulty swallowing or abdominal discomfort  BP 137/82 (BP Location: Right Arm, Patient Position: Sitting, Cuff Size: Normal)   Pulse 73   Temp 98.4 F (36.9 C) (Skin)   Resp 16   Ht 5\' 2"  (1.575 m)   Wt 150 lb 3.2 oz (68.1 kg)   LMP  (LMP Unknown)   SpO2 96% Comment: ON RA  BMI 27.47 kg/m  Physical Exam      Exam    General- alert and comfortable    Neck- no JVD, no cervical adenopathy palpable, no carotid  bruit   Lungs- clear without rales, wheezes   Cor- regular rate and rhythm, no murmur , gallop   Abdomen- soft, non-tender   Extremities - warm, non-tender, minimal edema   Neuro- oriented, appropriate, no focal weakness   Diagnostic Tests: Images of today CT personally reviewed and discussed with patient. She has a stable, low risk fusiform ascending aneurysm diameter 4.5 cm.  It has not changed in 8 months.  Impression: She understands that her risk for aortic dissection at an aortic  diameter 4.5 cm is less than 2 %/year.  Blood pressure control is the most important factor in preventing further dilatation of aorta which she understands.  She will continue her atenolol and heart healthy lifestyle.  We will continue to assess the aortic diameter with annual CT scans.  Plan: Return in 1 year with CTA of thoracic  aorta--follow-up Moderate fusiform ascending aneurysm.  Len Childs, MD [facility] [phone]

## 2019-01-01 IMAGING — CT CT ANGIO CHEST
2 of 7 series · 18 of 46 positions shown · IV contrast (APPLIED)
Comparison: None.

CLINICAL DATA: Ascending thoracic aortic aneurysm.

EXAM:
CT ANGIOGRAPHY CHEST WITH CONTRAST
TECHNIQUE: Multidetector CT imaging of the chest was performed using the
standard protocol during bolus administration of intravenous
contrast. Multiplanar CT image reconstructions and MIPs were
obtained to evaluate the vascular anatomy.
CONTRAST:  100 cc Isovue 370 intravenous contrast.

[Series 7: thins · axial · 0.66mm/px · z∈[+917,+1144]mm · 15 of 366 slices shown]
[im 21/366  lung]
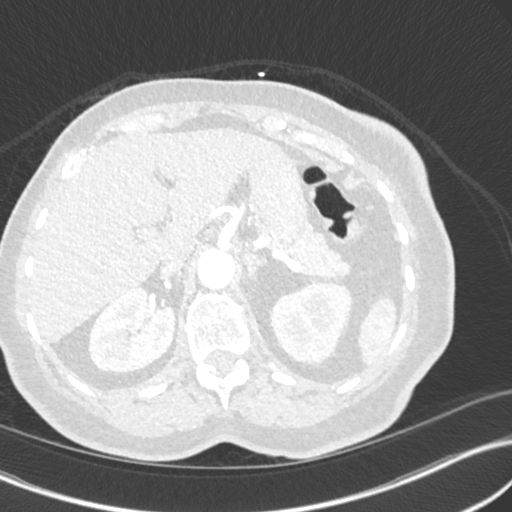
[im 41/366  soft-tissue]
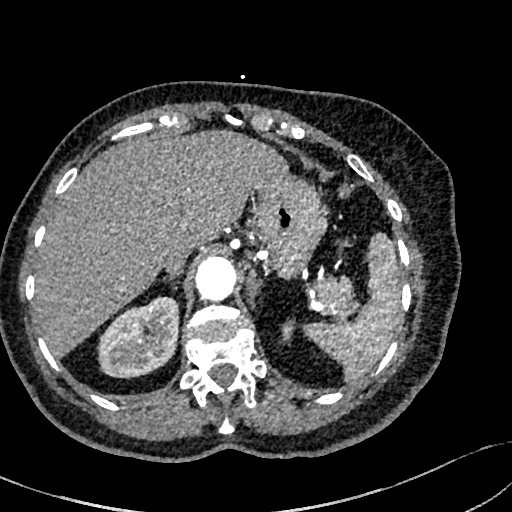
[im 61/366  lung]
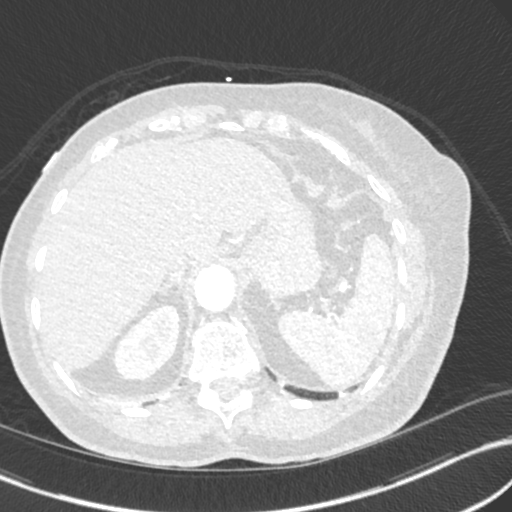
[im 82/366  soft-tissue]
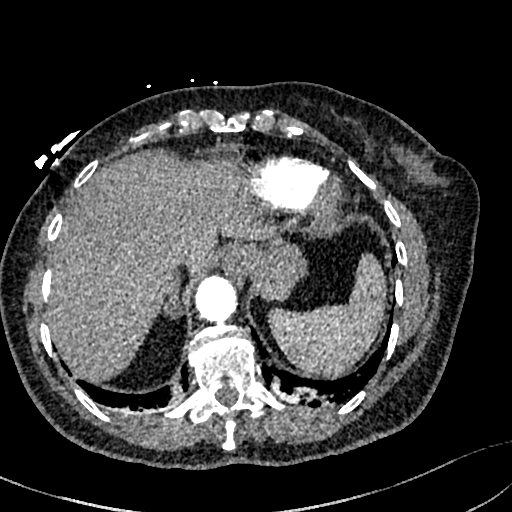
[im 122/366  lung]
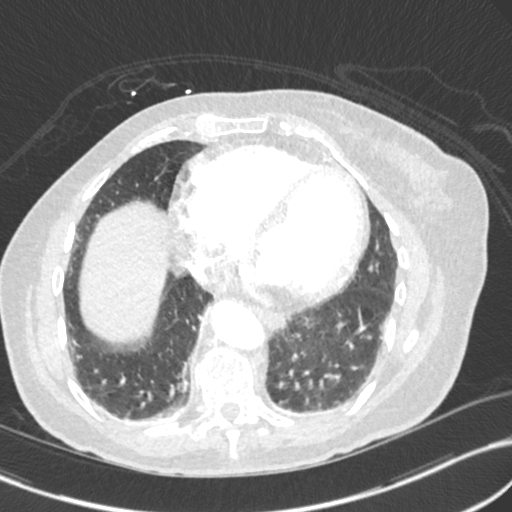
[im 142/366  soft-tissue]
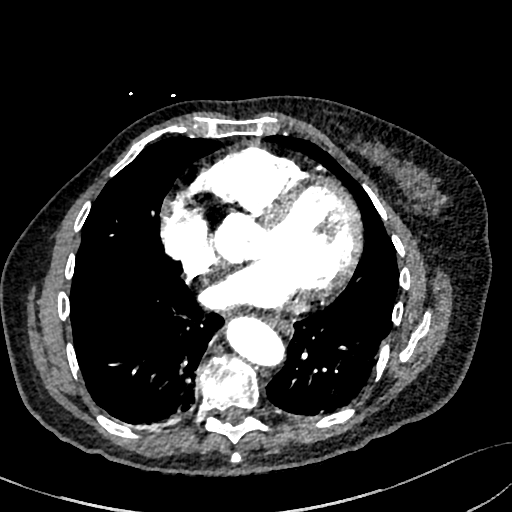
[im 163/366  lung]
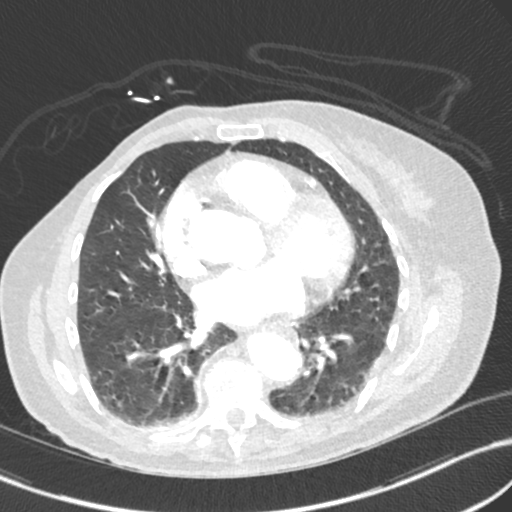
[im 183/366  soft-tissue]
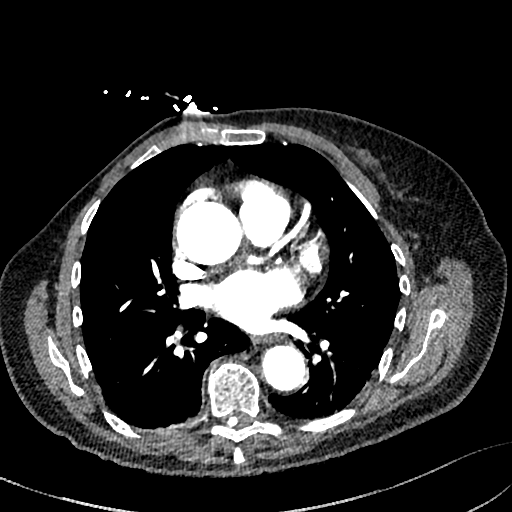
[im 203/366  lung]
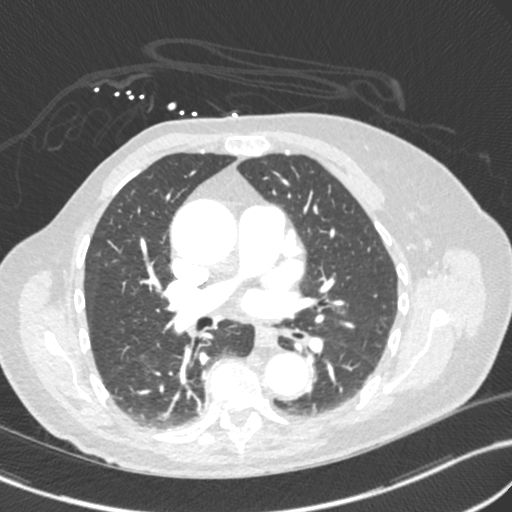
[im 224/366  soft-tissue]
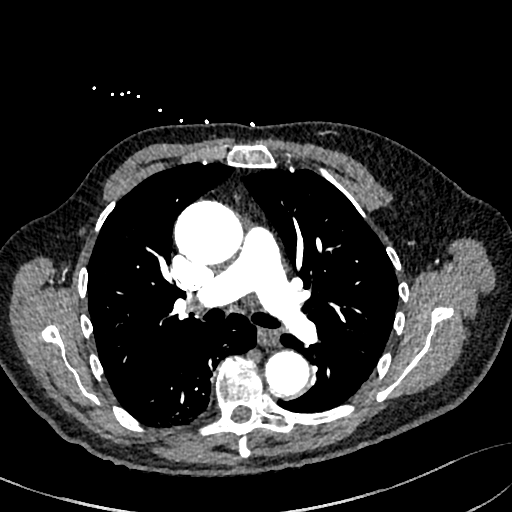
[im 244/366  lung]
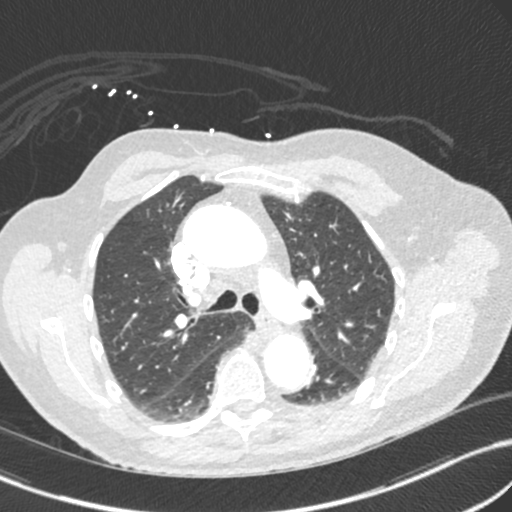
[im 284/366  soft-tissue]
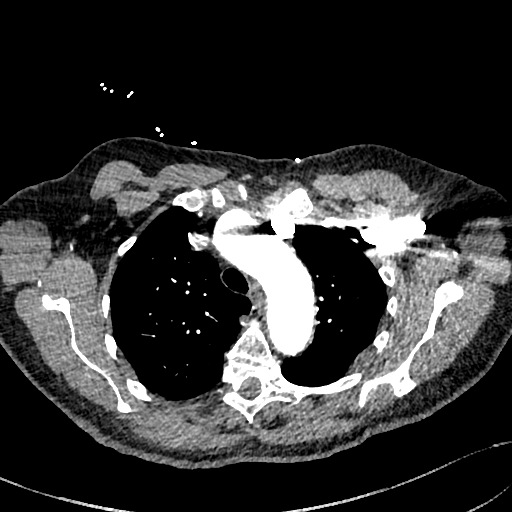
[im 305/366  lung]
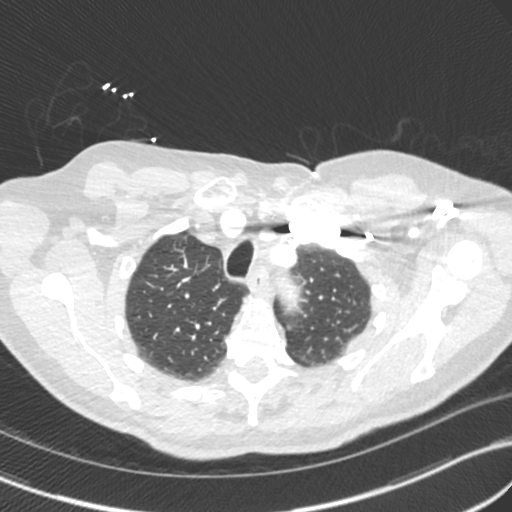
[im 325/366  soft-tissue]
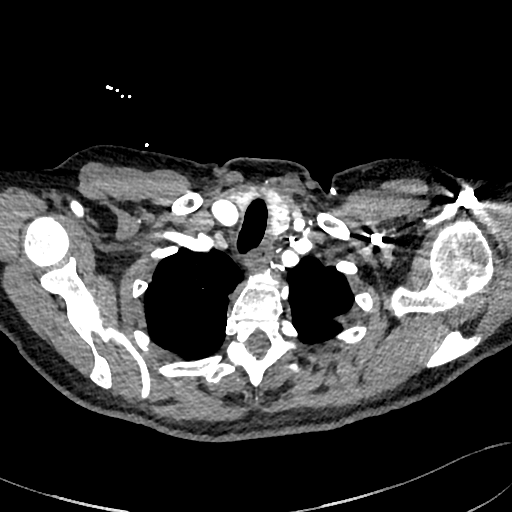
[im 345/366  lung]
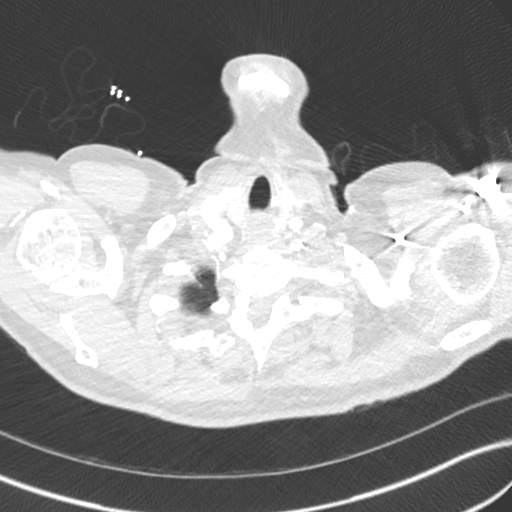

[Series 8: cor · coronal · 0.55mm/px · 3 of 140 slices shown]
[im 35/140  soft-tissue]
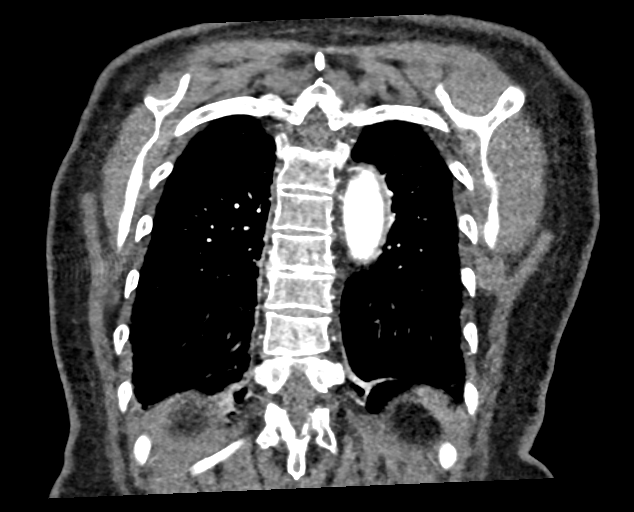
[im 70/140  soft-tissue]
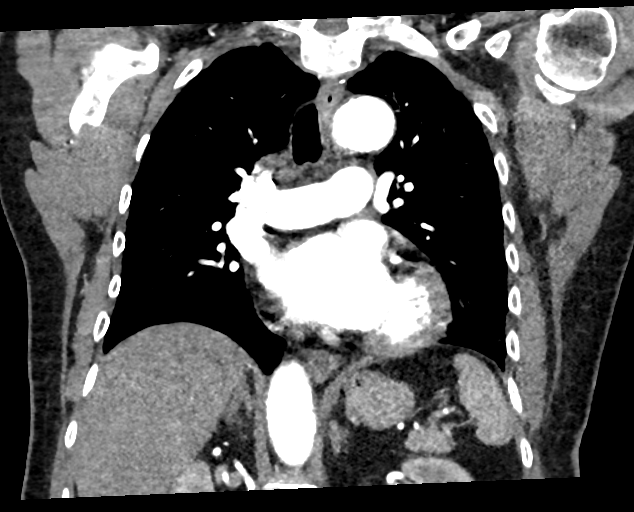
[im 105/140  soft-tissue]
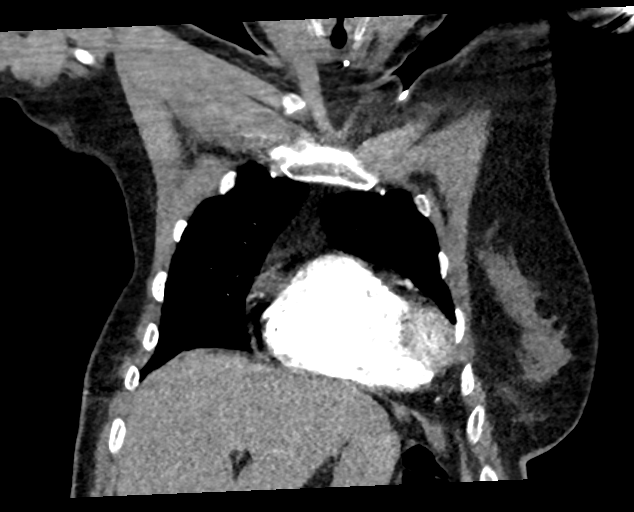

[18 of 46 positions shown; findings below may reference images not displayed]

FINDINGS: Cardiovascular: Aneurysmal dilatation of the ascending thoracic
aorta, measuring up to 4.2 cm. No evidence of dissection. Minimal
atherosclerotic vascular calcifications at the aortic arch. Mild
cardiomegaly. No pericardial effusion. No central pulmonary
embolism.

Mediastinum/Nodes: No enlarged mediastinal, hilar, or axillary lymph
nodes. Trachea and esophagus demonstrate no significant findings.

Lungs/Pleura: Bibasilar atelectasis. No suspicious pulmonary nodule.
No pleural effusion or pneumothorax.

Upper Abdomen: No acute abnormality.

Musculoskeletal: Prior right mastectomy. Unchanged mild compression
deformity of the T10 vertebral body. Exaggerated thoracic kyphosis.

At the level T8, there is a round 7 x 8 x 8 mm calcified density in
the right aspect of the spinal canal (series 6, image 66).

Review of the MIP images confirms the above findings.
IMPRESSION: 1. Aneurysmal dilatation of the ascending thoracic aorta, measuring
up to 4.2 cm. No evidence of dissection. Recommend annual imaging
followup by CTA or MRA. This recommendation follows 3040
ACCF/AHA/AATS/ACR/ASA/SCA/RC/BARZUNA/[place]/SUNDBERG Guidelines for the
Diagnosis and Management of Patients with Thoracic Aortic Disease.
Circulation. 3040; 121: [id number]
2. Round 8 mm calcified lesion in the right aspect of the spinal
canal at the level of T8, possibly a meningioma. Consider further
evaluation with nonemergent/outpatient contrast-enhanced MRI of the
thoracic spine.

## 2019-01-05 DIAGNOSIS — Z853 Personal history of malignant neoplasm of breast: Secondary | ICD-10-CM

## 2019-01-05 DIAGNOSIS — M899 Disorder of bone, unspecified: Secondary | ICD-10-CM

## 2019-01-05 DIAGNOSIS — M8589 Other specified disorders of bone density and structure, multiple sites: Secondary | ICD-10-CM

## 2019-01-05 DIAGNOSIS — C50419 Malignant neoplasm of upper-outer quadrant of unspecified female breast: Secondary | ICD-10-CM

## 2019-02-11 NOTE — Progress Notes (Signed)
CARDIOLOGY OFFICE NOTE  Date:  02/14/2019    Zoe Andrade Date of Birth: December 24, 1940 Medical Record [id number]  PCP:  Irven Shelling, MD  Cardiologist:  Marisa Cyphers   Chief Complaint  Patient presents with  . Follow-up    History of Present Illness: Zoe Andrade is a 78 y.o. female who presents today for a 6 month check.  Seen for Dr. Marlou Porch.   She was seen here back in [place] 2028forevaluation of paroxysmal atrial fibrillation at the request of Dr.Sethi. [facility] and sweating and was diagnosed with new onset atrial fibrillation. She was started on both Eliquis and aspirin. It was recommended that she discontinue the aspirin and stay on Eliquis alone.  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 There was also an incidental finding of potential meningioma at T9. She has been referred over to [facility].   In October of 2019 - she started having lots of issues with her BP and associated medicines. Her Plendil had been stopped due to swelling. She ended up requiring multiple agents. Had renal doppler as well - this has shown 1 to 59% narrowing on the right. Her cuff has been noted to run a little higher. At our last visit in February - she was back off prior medicines and back on Plendil. BP was doing ok.   The patient does not have symptoms concerning for COVID-19 infection (fever, chills, cough, or new shortness of breath).   Comes in today. Here alone. Doing well. She is "being a good girl" and staying home. Walking most days about a mile. No chest pain. Breathing is ok. She had a stable report with Dr. Darcey Nora regarding her aneurysm. BP is good. She was more upset about her IV infiltrating for the scan. Tolerating her medicines. Overall, she feels like she is doing ok.    Past Medical History:  Diagnosis Date  . Cancer ([employer])   . Hypertension   . 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 MASTECTOMY Right   . TEE WITHOUT CARDIOVERSION N/A 03/02/2017   Procedure: TRANSESOPHAGEAL ECHOCARDIOGRAM (TEE);  Surgeon: Dorothy Spark, MD;  Location: [facility] ENDOSCOPY;  Service: Cardiovascular;  Laterality: N/A;     Medications: Current Meds  Medication Sig  . atenolol (TENORMIN) 25 MG tablet Take 1 tablet (25 mg total) by mouth daily.  . cholecalciferol (VITAMIN D3) 25 MCG (1000 UT) tablet Take 2,000 Units by mouth 2 (two) times daily.   Marland Kitchen ELIQUIS 5 MG TABS tablet TAKE 1 TABLET TWICE A DAY  . felodipine (PLENDIL) 10 MG 24 hr tablet Take 1 tablet (10 mg total) by mouth daily.  . Omega-3 Fatty Acids (FISH OIL) 1000 MG CAPS Take 2,000 mg by mouth daily.     Allergies: No Known Allergies  Social History: The patient  reports that she has never smoked. She has never used smokeless tobacco. She reports that she does not drink alcohol or use drugs.   Family History: The patient's family history includes Breast cancer in her sister; Congestive Heart Failure in her father; Heart attack (age of onset: 69) in her brother; High blood pressure in her daughter, father, and mother; Hyperthyroidism in her sister.   Review of Systems: Please see the history of present illness.   All other systems are reviewed and negative.  Physical Exam: VS:  BP 130/82   Pulse (!) 59   Ht 5\' 2"  (1.575 m)   Wt 155 lb 6.4 oz (70.5 kg)   LMP  (LMP Unknown)   SpO2 97%   BMI 28.42 kg/m  .  BMI Body mass index is 28.42 kg/m.  Wt Readings from Last 3 Encounters:  02/14/19 155 lb 6.4 oz (70.5 kg)  11/10/18 150 lb 3.2 oz (68.1 kg)  08/04/18 154 lb 6.4 oz (70 kg)    General: Pleasant. Well developed, well nourished and in no acute distress.   HEENT: Normal.  Neck: Supple, no JVD, carotid bruits, or masses noted.  Cardiac: Regular rate and rhythm. No murmurs, rubs, or gallops. No edema.  Respiratory:  Lungs are  clear to auscultation bilaterally with normal work of breathing.  GI: Soft and nontender.  MS: No deformity or atrophy. Gait and ROM intact.  Skin: Warm and dry. Color is normal.  Neuro:  Strength and sensation are intact and no gross focal deficits noted.  Psych: Alert, appropriate and with normal affect.   LABORATORY DATA:  EKG:  EKG is not ordered today.   Lab Results  Component Value Date   WBC 8.1 03/02/2017   HGB 13.0 03/02/2017   HCT 38.2 03/02/2017   PLT 164 03/02/2017   GLUCOSE 85 04/02/2018   CHOL 186 02/26/2017   TRIG 101 02/26/2017   HDL 51 02/26/2017   LDLCALC 115 (H) 02/26/2017   ALT 21 02/25/2017   AST 23 02/25/2017   NA 141 04/02/2018   K 4.3 04/02/2018   CL 102 04/02/2018   CREATININE 0.74 04/02/2018   BUN 11 04/02/2018   CO2 21 04/02/2018   INR 0.92 02/25/2017   HGBA1C 5.3 02/26/2017     BNP (last 3 results) No results for input(s): BNP in the last 8760 hours.  ProBNP (last 3 results) No results for input(s): PROBNP in the last 8760 hours.   Other Studies Reviewed Today:  Renal Duplex 04/2018:  Right: Normal size right kidney. Normal right Resistive Index. Normal cortical thickness of right kidney. 1-59% stenosis of the right renal artery. RRV flow present. Probable simple cyst at the mid pole, measuring 2.4 x 1.4 x 2.2 cm. Left: Normal size of left kidney. Normal left Resistive Index. Normal cortical thickness of the left kidney. No evidence of left renal artery stenosis. LRV flow present. Mesenteric: Normal Celiac artery and Superior Mesenteric artery findings.  Patent IVC.    TTE9/2018 - Left ventricle: The cavity size was normal. Systolic function was normal. The estimated ejection fraction was in the range of 60%to 65%. Wall motion was normal; there were no regional wallmotion abnormalities. Doppler parameters are consistent with abnormal left ventricular relaxation (grade 1 diastolic  dysfunction). - Left atrium: The atrium was mildly dilated. Anterior-posterior dimension: 41 mm. Impressions: - No cardiac source of emboli was indentified.  Lower Extremity US (DVT) 02/28/2017 No evidence of deep vein or superficial thrombosis involving the right lower extremity and left lower extremity.  TEE 03/02/2017 Left Ventrical:LVEF 60-65% Mitral Valve:No MR Aortic Valve:No AI Tricuspid Valve: Mild TR Pulmonic Valve: No PR Left Atrium/ Left atrial appendage: No thrombus, normal emptying and filling velocities. Atrial septum:Hypermobile, lipomatous hypertrophy of the interatrial septum. No PFO, negative bubble study. Aorta:Severe non-mobile atheroma in the descending thoracic aorta. Ascending aorta is dilated measuring 42-43 mm, a dissection can't be excluded on current images, a dedicated chest CTA will be ordered, this was discussed with Dr Leonie Man, the patient  and her daughter. They agree. She will be referred to CV surgery and to me for a follow up.   CT angio of chest9/2018:  1. Aneurysmal dilatation of the ascending thoracic aorta, measuring up to 4.2 cm. No evidence of dissection.Recommend annual imaging followup by CTA or MRA. This recommendation follows 2010 ACCF/AHA/AATS/ACR/ASA/SCA/SCAI/SIR/STS/SVM Guidelines for the Diagnosis and Management of Patients with Thoracic Aortic Disease. Circulation. 2010; 121: [id number] 2. Round 8 mm calcified lesion in the right aspect of the spinal canal at the level of T8, possibly a meningioma. Consider further evaluation with nonemergent/outpatient contrast-enhanced MRI of the thoracic spine.   Assessment/Plan:  1. HTN - prior bout of lability - not really ever clear as to why - back on Plendil - BP is good. No changes made today.   2. PAF - rhythm is stable. NSR on exam today.   3. Aortic atherosclerosis -  Managed medically. No active symptoms.   4. Ascending aortic aneurysm - she sees Dr. Prescott Gum - has  good BP control.   5. Prior stroke - no deficits noted.   6. HLD-on statin.Lab today.   7. High risk medicine - lab today. No problems noted.   8. COVID-19 Education: The signs and symptoms of COVID-19 were discussed with the patient and how to seek care for testing (follow up with PCP or arrange E-visit).  The importance of social distancing, staying at home, hand hygiene and wearing a mask when out in public were discussed today.  Current medicines are reviewed with the patient today.  The patient does not have concerns regarding medicines other than what has been noted above.  The following changes have been made:  See above.  Labs/ tests ordered today include:    Orders Placed This Encounter  Procedures  . Basic metabolic panel  . CBC  . Hepatic function panel  . Lipid panel     Disposition:   FU with me in 6 months.    Patient is agreeable to this plan and will call if any problems develop in the interim.   SignedTruitt Merle, NP  02/14/2019 10:03 AM  [facility] [address] [place], [place]  [address] Phone: [phone] Fax: [phone]

## 2019-02-14 ENCOUNTER — Encounter: Payer: Self-pay | Admitting: Nurse Practitioner

## 2019-02-14 ENCOUNTER — Ambulatory Visit (INDEPENDENT_AMBULATORY_CARE_PROVIDER_SITE_OTHER): Payer: Medicare HMO | Admitting: Nurse Practitioner

## 2019-02-14 ENCOUNTER — Other Ambulatory Visit: Payer: Self-pay

## 2019-02-14 VITALS — BP 130/82 | HR 59 | Ht 62.0 in | Wt 155.4 lb

## 2019-02-14 DIAGNOSIS — I712 Thoracic aortic aneurysm, without rupture: Secondary | ICD-10-CM

## 2019-02-14 DIAGNOSIS — I480 Paroxysmal atrial fibrillation: Secondary | ICD-10-CM | POA: Diagnosis not present

## 2019-02-14 DIAGNOSIS — Z7189 Other specified counseling: Secondary | ICD-10-CM

## 2019-02-14 DIAGNOSIS — I700 Atherosclerosis of aorta: Secondary | ICD-10-CM

## 2019-02-14 DIAGNOSIS — I6389 Other cerebral infarction: Secondary | ICD-10-CM

## 2019-02-14 DIAGNOSIS — I10 Essential (primary) hypertension: Secondary | ICD-10-CM

## 2019-02-14 DIAGNOSIS — Z79899 Other long term (current) drug therapy: Secondary | ICD-10-CM

## 2019-02-14 DIAGNOSIS — Z7901 Long term (current) use of anticoagulants: Secondary | ICD-10-CM

## 2019-02-14 DIAGNOSIS — I7121 Aneurysm of the ascending aorta, without rupture: Secondary | ICD-10-CM

## 2019-02-14 LAB — LIPID PANEL
Chol/HDL Ratio: 3.2 ratio (ref 0.0–4.4)
Cholesterol, Total: 225 mg/dL — ABNORMAL HIGH (ref 100–199)
HDL: 70 mg/dL (ref >39)
LDL Chol Calc (NIH): 133 mg/dL — ABNORMAL HIGH (ref 0–99)
Triglycerides: 126 mg/dL (ref 0–149)
VLDL Cholesterol Cal: 22 mg/dL (ref 5–40)

## 2019-02-14 LAB — CBC
Hematocrit: 42.1 % (ref 34.0–46.6)
Hemoglobin: 14.5 g/dL (ref 11.1–15.9)
MCH: 31 pg (ref 26.6–33.0)
MCHC: 34.4 g/dL (ref 31.5–35.7)
MCV: 90 fL (ref 79–97)
Platelets: 214 10*3/uL (ref 150–450)
RBC: 4.67 x10E6/uL (ref 3.77–5.28)
RDW: 11.9 % (ref 11.7–15.4)
WBC: 8.4 10*3/uL (ref 3.4–10.8)

## 2019-02-14 LAB — BASIC METABOLIC PANEL
BUN/Creatinine Ratio: 14 (ref 12–28)
BUN: 10 mg/dL (ref 8–27)
CO2: 22 mmol/L (ref 20–29)
Calcium: 10.1 mg/dL (ref 8.7–10.3)
Chloride: 105 mmol/L (ref 96–106)
Creatinine, Ser: 0.74 mg/dL (ref 0.57–1.00)
GFR calc Af Amer: 90 mL/min/{1.73_m2} (ref >59)
GFR calc non Af Amer: 78 mL/min/{1.73_m2} (ref >59)
Glucose: 89 mg/dL (ref 65–99)
Potassium: 4.4 mmol/L (ref 3.5–5.2)
Sodium: 141 mmol/L (ref 134–144)

## 2019-02-14 LAB — HEPATIC FUNCTION PANEL
ALT: 65 IU/L — ABNORMAL HIGH (ref 0–32)
AST: 72 IU/L — ABNORMAL HIGH (ref 0–40)
Albumin: 4.8 g/dL — ABNORMAL HIGH (ref 3.7–4.7)
Alkaline Phosphatase: 118 IU/L — ABNORMAL HIGH (ref 39–117)
Bilirubin Total: 0.4 mg/dL (ref 0.0–1.2)
Bilirubin, Direct: 0.13 mg/dL (ref 0.00–0.40)
Total Protein: 7.6 g/dL (ref 6.0–8.5)

## 2019-02-14 NOTE — Patient Instructions (Addendum)
After Visit Summary:  We will be checking the following labs today - BMET, CBC, HPF and lipids   Medication Instructions:    Continue with your current medicines.    If you need a refill on your cardiac medications before your next appointment, please call your pharmacy.     Testing/Procedures To Be Arranged:  N/A  Follow-Up:   See me in 6 months    At [facility], you and your health needs are our priority.  As part of our continuing mission to provide you with exceptional heart care, we have created designated Provider Care Teams.  These Care Teams include your primary Cardiologist (physician) and Advanced Practice Providers (APPs -  Physician Assistants and Nurse Practitioners) who all work together to provide you with the care you need, when you need it.  Special Instructions:  . Stay safe, stay home, wash your hands for at least 20 seconds and wear a mask when out in public.  . It was good to talk with you today.    Call the [facility] office at [phone] if you have any questions, problems or concerns.       

## 2019-04-04 ENCOUNTER — Encounter: Payer: Self-pay | Admitting: *Deleted

## 2019-04-11 ENCOUNTER — Other Ambulatory Visit: Payer: Self-pay | Admitting: Nurse Practitioner

## 2019-04-12 ENCOUNTER — Other Ambulatory Visit: Payer: Self-pay | Admitting: Nurse Practitioner

## 2019-04-12 MED ORDER — ATENOLOL 25 MG PO TABS: 25.0000 mg | ORAL_TABLET | Freq: Every day | ORAL | 2 refills | Status: DC

## 2019-04-12 NOTE — Telephone Encounter (Signed)
Pt's medication was sent to pt's pharmacy as requested. Confirmation received.  °

## 2019-08-03 ENCOUNTER — Encounter (INDEPENDENT_AMBULATORY_CARE_PROVIDER_SITE_OTHER): Payer: Self-pay

## 2019-08-03 ENCOUNTER — Other Ambulatory Visit: Payer: Self-pay

## 2019-08-03 ENCOUNTER — Ambulatory Visit: Payer: Medicare HMO | Admitting: Cardiology

## 2019-08-03 ENCOUNTER — Encounter: Payer: Self-pay | Admitting: Cardiology

## 2019-08-03 ENCOUNTER — Ambulatory Visit: Payer: Medicare HMO | Admitting: Nurse Practitioner

## 2019-08-03 VITALS — BP 120/80 | HR 55 | Ht 62.0 in | Wt 158.0 lb

## 2019-08-03 DIAGNOSIS — I10 Essential (primary) hypertension: Secondary | ICD-10-CM

## 2019-08-03 DIAGNOSIS — Z79899 Other long term (current) drug therapy: Secondary | ICD-10-CM | POA: Diagnosis not present

## 2019-08-03 DIAGNOSIS — E7800 Pure hypercholesterolemia, unspecified: Secondary | ICD-10-CM | POA: Diagnosis not present

## 2019-08-03 MED ORDER — ROSUVASTATIN CALCIUM 10 MG PO TABS: 10.0000 mg | ORAL_TABLET | Freq: Every day | ORAL | 11 refills | Status: DC

## 2019-08-03 NOTE — Progress Notes (Signed)
Cardiology Office Note:    Date:  08/03/2019   ID:  Zoe Andrade, DOB [birth year], MRN [id number]  PCP:  Zoe Nasuti, MD  Cardiologist:  Candee Furbish, MD  Electrophysiologist:  None   Referring MD: Zoe Nasuti, MD     History of Present Illness:    Zoe Andrade is a 79 y.o. female with paroxysmal atrial fibrillation here for follow-up.  EKG today shows A. fib 55 bpm well controlled.  Previously seen by Zoe Andrade.  She is unaware that she is in atrial fibrillation today.  Feels well.  Discussed importance of Covid vaccine.  She is on Eliquis.  CT scan showed 43 mm ascending root dilatation.  T CTS has seen as well.  Monitoring.  Her IV infiltrated once during exam.  Also on Plendil for right-sided renal artery, no PAD.  Previously had some lower extremity swelling with this.  Left leg tendonitis.   Atorvastatin was used in the past.  At some point this was stopped.  She is willing to try Crestor, see below  Past Medical History:  Diagnosis Date  . Cancer ([place])   . Hypertension   . 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 MASTECTOMY Right   . TEE WITHOUT CARDIOVERSION N/A 03/02/2017   Procedure: TRANSESOPHAGEAL ECHOCARDIOGRAM (TEE);  Surgeon: Dorothy Spark, MD;  Location: [facility] ENDOSCOPY;  Service: Cardiovascular;  Laterality: N/A;    Current Medications: Current Meds  Medication Sig  . atenolol (TENORMIN) 25 MG tablet Take 1 tablet (25 mg total) by mouth daily.  . cholecalciferol (VITAMIN D3) 25 MCG (1000 UT) tablet Take 2,000 Units by mouth 2 (two) times daily.   Marland Kitchen ELIQUIS 5 MG TABS tablet TAKE 1 TABLET TWICE A DAY  . felodipine (PLENDIL) 10 MG 24 hr tablet Take 1 tablet (10 mg total) by mouth daily.  . Omega-3 Fatty Acids (FISH OIL) 1000 MG CAPS Take 2,000 mg by mouth daily.     Allergies:   Patient has no known allergies.   Social History   Socioeconomic History   . Marital status: Widowed    Spouse name: Not on file  . Number of children: Not on file  . Years of education: Not on file  . Highest education level: Not on file  Occupational History  . Not on file  Tobacco Use  . Smoking status: Never Smoker  . Smokeless tobacco: Never Used  Substance and Sexual Activity  . Alcohol use: No    Comment: uta  . Drug use: No  . Sexual activity: Not on file  Other Topics Concern  . Not on file  Social History Narrative  . Not on file   Social Determinants of Health   Financial Resource Strain:   . Difficulty of Paying Living Expenses: Not on file  Food Insecurity:   . Worried About Charity fundraiser in the Last Year: Not on file  . Ran Out of Food in the Last Year: Not on file  Transportation Needs:   . Lack of Transportation (Medical): Not on file  . Lack of Transportation (Non-Medical): Not on file  Physical Activity:   . Days of Exercise per Week: Not on file  . Minutes of Exercise per Session: Not on file  Stress:   . Feeling of Stress : Not on file  Social Connections:   . Frequency of Communication with Friends and Family: Not  on file  . Frequency of Social Gatherings with Friends and Family: Not on file  . Attends Religious Services: Not on file  . Active Member of Clubs or Organizations: Not on file  . Attends Archivist Meetings: Not on file  . Marital Status: Not on file     Family History: The patient's family history includes Breast cancer in her sister; Congestive Heart Failure in her father; Heart attack (age of onset: 1) in her brother; High blood pressure in her daughter, father, and mother; Hyperthyroidism in her sister.  ROS:   Please see the history of present illness.     All other systems reviewed and are negative.  EKGs/Labs/Other Studies Reviewed:    The following studies were reviewed today:   EKG:  EKG is  ordered today.  The ekg ordered today demonstrates atrial fibrillation 55 with no  other abnormalities.  Recent Labs: 02/14/2019: ALT 65; BUN 10; Creatinine, Ser 0.74; Hemoglobin 14.5; Platelets 214; Potassium 4.4; Sodium 141  Recent Lipid Panel    Component Value Date/Time   CHOL 225 (H) 02/14/2019 1022   TRIG 126 02/14/2019 1022   HDL 70 02/14/2019 1022   CHOLHDL 3.2 02/14/2019 1022   CHOLHDL 3.6 02/26/2017 0208   VLDL 20 02/26/2017 0208   LDLCALC 133 (H) 02/14/2019 1022    Physical Exam:    VS:  BP 120/80   Pulse (!) 55   Ht 5\' 2"  (1.575 m)   Wt 158 lb (71.7 kg)   LMP  (LMP Unknown)   SpO2 97%   BMI 28.90 kg/m     Wt Readings from Last 3 Encounters:  08/03/19 158 lb (71.7 kg)  02/14/19 155 lb 6.4 oz (70.5 kg)  11/10/18 150 lb 3.2 oz (68.1 kg)     GEN:  Well nourished, well developed in no acute distress HEENT: Normal NECK: No JVD; No carotid bruits LYMPHATICS: No lymphadenopathy CARDIAC: IRRR, no murmurs, rubs, gallops RESPIRATORY:  Clear to auscultation without rales, wheezing or rhonchi  ABDOMEN: Soft, non-tender, non-distended MUSCULOSKELETAL:  No edema; No deformity  SKIN: Warm and dry NEUROLOGIC:  Alert and oriented x 3 PSYCHIATRIC:  Normal affect   ASSESSMENT:    1. Essential hypertension   2. Pure hypercholesterolemia   3. Medication management    PLAN:    In order of problems listed above:  Paroxysmal atrial fibrillation -Currently in atrial fibrillation.  Well rate controlled.  Continuing with Eliquis.  No bleeding.  Currently does not realize that she was in atrial fibrillation earlier this morning but her heart rate is under good control at 55 bpm.  I discussed that she was protected with Eliquis.  Reassurance.  Aortic atherosclerosis -Continue with good overall blood pressure control.  Consider statin therapy. In fact will start Crestor 10mg  PO QD.  She will let us know if she has any significant myalgias.  Ascending aortic aneurysm -Dr. Darcey Nora, good blood pressure control.  Prior stroke -On Eliquis.  Also would like  for her to be on a statin because of this as well.  Last LDL 115.   I will have her see Zoe Andrade in 6 months, me in 1 year.  Prior to her 40-month follow-up we will check a lipid panel and ALT.  Medication Adjustments/Labs and Tests Ordered: Current medicines are reviewed at length with the patient today.  Concerns regarding medicines are outlined above.  Orders Placed This Encounter  Procedures  . ALT  . Lipid panel  . EKG 12-Lead  Meds ordered this encounter  Medications  . rosuvastatin (CRESTOR) 10 MG tablet    Sig: Take 1 tablet (10 mg total) by mouth at bedtime.    Dispense:  30 tablet    Refill:  11    Patient Instructions  Medication Instructions:  Start Crestor 10 mg a day. Continue all other medications as listed.  *If you need a refill on your cardiac medications before your next appointment, please call your pharmacy*  Lab Work: Please return fasting for Lipid and ALT before your appointment with Truitt Merle, NP.  If you have labs (blood work) drawn today and your tests are completely normal, you will receive your results only by: Marland Kitchen MyChart Message (if you have MyChart) OR . A paper copy in the mail If you have any lab test that is abnormal or we need to change your treatment, we will call you to review the results.  Follow-Up: At [facility], you and your health needs are our priority.  As part of our continuing mission to provide you with exceptional heart care, we have created designated Provider Care Teams.  These Care Teams include your primary Cardiologist (physician) and Advanced Practice Providers (APPs -  Physician Assistants and Nurse Practitioners) who all work together to provide you with the care you need, when you need it.  Your next appointment:   6 month(s)  The format for your next appointment:   In Person  Provider:   Truitt Merle, NP and 1 year with Dr Marlou Porch.  Thank you for choosing [facility]!!         Signed, Candee Furbish, MD  08/03/2019 11:11 AM    [place]

## 2019-08-03 NOTE — Patient Instructions (Signed)
Medication Instructions:  Start Crestor 10 mg a day. Continue all other medications as listed.  *If you need a refill on your cardiac medications before your next appointment, please call your pharmacy*  Lab Work: Please return fasting for Lipid and ALT before your appointment with Truitt Merle, NP.  If you have labs (blood work) drawn today and your tests are completely normal, you will receive your results only by: Marland Kitchen MyChart Message (if you have MyChart) OR . A paper copy in the mail If you have any lab test that is abnormal or we need to change your treatment, we will call you to review the results.  Follow-Up: At [facility], you and your health needs are our priority.  As part of our continuing mission to provide you with exceptional heart care, we have created designated Provider Care Teams.  These Care Teams include your primary Cardiologist (physician) and Advanced Practice Providers (APPs -  Physician Assistants and Nurse Practitioners) who all work together to provide you with the care you need, when you need it.  Your next appointment:   6 month(s)  The format for your next appointment:   In Person  Provider:   Truitt Merle, NP and 1 year with Dr Marlou Porch.  Thank you for choosing [facility]!!

## 2019-08-10 ENCOUNTER — Ambulatory Visit: Payer: Medicare HMO | Admitting: Nurse Practitioner

## 2019-08-19 ENCOUNTER — Telehealth: Payer: Self-pay | Admitting: Cardiology

## 2019-08-19 NOTE — Telephone Encounter (Signed)
I spoke with patient. At the end of last week she started having muscle aches. Was started on Rosuvastatin last month. She reports she was previously on Atorvastatin and this was stopped due to muscle spasms. She states the aches she is having now are a little different. I advised her to stop Rosuvastatin for 2-3 weeks and then call us back with an update.

## 2019-08-19 NOTE — Telephone Encounter (Signed)
Pt c/o medication issue:  1. Name of Medication: rosuvastatin (CRESTOR) 10 MG tablet  2. How are you currently taking this medication (dosage and times per day)? Once a day  3. Are you having a reaction (difficulty breathing--STAT)? no  4. What is your medication issue? Patient states she has been having muscle soreness in her calves, legs, knees, and shoulders. Please advise.

## 2019-08-21 NOTE — Telephone Encounter (Signed)
Agree with plan Damontae Loppnow, MD  

## 2019-09-27 ENCOUNTER — Other Ambulatory Visit: Payer: Self-pay | Admitting: Cardiothoracic Surgery

## 2019-09-27 DIAGNOSIS — I712 Thoracic aortic aneurysm, without rupture, unspecified: Secondary | ICD-10-CM

## 2019-10-26 ENCOUNTER — Ambulatory Visit
Admission: RE | Admit: 2019-10-26 | Discharge: 2019-10-26 | Disposition: A | Discharge status: Home / Self Care | Payer: Medicare HMO | Source: Ambulatory Visit | Attending: Cardiothoracic Surgery | Admitting: Cardiothoracic Surgery

## 2019-10-26 ENCOUNTER — Encounter: Payer: Self-pay | Admitting: Cardiothoracic Surgery

## 2019-10-26 ENCOUNTER — Ambulatory Visit: Payer: Medicare HMO | Admitting: Cardiothoracic Surgery

## 2019-10-26 ENCOUNTER — Other Ambulatory Visit: Payer: Self-pay

## 2019-10-26 DIAGNOSIS — I712 Thoracic aortic aneurysm, without rupture, unspecified: Secondary | ICD-10-CM

## 2019-10-26 DIAGNOSIS — I77810 Thoracic aortic ectasia: Secondary | ICD-10-CM

## 2019-10-26 HISTORY — DX: Thoracic aortic ectasia: I77.810

## 2019-10-26 MED ORDER — IOPAMIDOL (ISOVUE-370) INJECTION 76%
75.0000 mL | Freq: Once | INTRAVENOUS | Status: AC | PRN
  Administered 2019-10-26: 75 mL via INTRAVENOUS

## 2019-10-26 NOTE — Progress Notes (Signed)
PCP is Hague, Rosalyn Charters, MD Referring Provider is [name], NP  Chief Complaint  Patient presents with  . 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 It is asymptomatic.  The ascending aortic dilatation was initially noted as an incidental finding on echocardiogram for atrial fibrillation. The patient has had serial CT scans of the thoracic aorta since 2018 and the diameter remains stable at 4.5 cm.  There is no evidence of intramural hematoma or ulceration. The patient remains asymptomatic. Her blood pressure is well controlled   Past Medical History:  Diagnosis Date  . Cancer ([place])   . Hypertension   . 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 MASTECTOMY Right   . TEE WITHOUT CARDIOVERSION N/A 03/02/2017   Procedure: TRANSESOPHAGEAL ECHOCARDIOGRAM (TEE);  Surgeon: Dorothy Spark, MD;  Location: [facility] ENDOSCOPY;  Service: Cardiovascular;  Laterality: N/A;    Family History  Problem Relation Age of Onset  . High blood pressure Mother   . High blood pressure Father   . Congestive Heart Failure Father   . Breast cancer Sister   . Hyperthyroidism Sister   . Heart attack Brother 58  . High blood pressure Daughter        age 54    Social History Social History   Tobacco Use  . Smoking status: Never Smoker  . Smokeless tobacco: Never Used  Substance Use Topics  . Alcohol use: No    Comment: uta  . Drug use: No    Current Outpatient Medications  Medication Sig Dispense Refill  . atenolol (TENORMIN) 25 MG tablet Take 1 tablet (25 mg total) by mouth daily. 90 tablet 2  . cholecalciferol (VITAMIN D3) 25 MCG (1000 UT) tablet Take 2,000 Units by mouth 2 (two) times daily.     Marland Kitchen ELIQUIS 5 MG TABS tablet TAKE 1 TABLET TWICE A DAY 180 tablet 2  . felodipine (PLENDIL) 10 MG 24 hr tablet Take 1  tablet (10 mg total) by mouth daily. 90 tablet 3  . Omega-3 Fatty Acids (FISH OIL) 1000 MG CAPS Take 2,000 mg by mouth daily.    . rosuvastatin (CRESTOR) 10 MG tablet Take 1 tablet (10 mg total) by mouth at bedtime. 30 tablet 11   No current facility-administered medications for this visit.    No Known Allergies  Review of Systems        She denies chest pain, history of thoracic trauma She denies bleeding complications from her Eliquis for her A. fib She has stopped her statin because of muscle cramps and myalgias.  Her symptoms improved.             Review of Systems :  [ y ] = yes, [  ] = no        General :  Weight gain [   ]    Weight loss  [   ]  Fatigue [  ]  Fever [  ]  Chills  [  ]                                          HEENT    Headache [  ]  Dizziness [  ]  Blurred vision [  ] Glaucoma  [  ]                          Nosebleeds [  ] Painful or loose teeth [  ]        Cardiac :  Chest pain/ pressure [  ]  Resting SOB [  ] exertional SOB [  ]                        Orthopnea [  ]  Pedal edema  [  ]  Palpitations [  ] Syncope/presyncope [ ]                         Paroxysmal nocturnal dyspnea [  ]         Pulmonary : cough [  ]  wheezing [  ]  Hemoptysis [  ] Sputum [  ] Snoring [  ]                              Pneumothorax [  ]  Sleep apnea [  ]        GI : Vomiting [  ]  Dysphagia [  ]  Melena  [  ]  Abdominal pain [  ] BRBPR [  ]              Heart burn [  ]  Constipation [  ] Diarrhea  [  ] Colonoscopy [   ]        GU : Hematuria [  ]  Dysuria [  ]  Nocturia [  ] UTI's [  ]        Vascular : Claudication [  ]  Rest pain [  ]  DVT [  ] Vein stripping [  ] leg ulcers [  ]                          TIA [  ] Stroke [  ]  Varicose veins [  ]        NEURO :  Headaches  [  ] Seizures [  ] Vision changes [  ] Paresthesias [  ]                                               Musculoskeletal :  Arthritis [  ] Gout  [  ]  Back pain [  ]  Joint pain [  ]        Skin :  Rash [   ]  Melanoma [  ] Sores [  ]        Heme : Bleeding problems [  ]Clotting Disorders [  ] Anemia [  ]Blood Transfusion [ ]         Endocrine : Diabetes [  ] Heat or Cold intolerance [  ] Polyuria [  ]excessive thirst [ ]         Psych : Depression [  ]  Anxiety [  ]  Psych hospitalizations [  ] Memory change [  ]  BP 120/76 (BP Location: Left Arm, Patient Position: Sitting, Cuff Size: Normal)   Pulse 71   Temp 98.1 F (36.7 C) (Temporal)   Resp 20   Ht 5\' 2"  (1.575 m)   Wt 153 lb 8 oz (69.6 kg)   LMP  (LMP Unknown)   SpO2 96% Comment: RA  BMI 28.08 kg/m  Physical Exam       Exam    General- alert and comfortable    Neck- no JVD, no cervical adenopathy palpable, no carotid bruit   Lungs- clear without rales, wheezes   Cor- irregular rate and rhythm, no murmur , gallop   Abdomen- soft, non-tender   Extremities - warm, non-tender, minimal edema   Neuro- oriented, appropriate, no focal weakness   Diagnostic Tests: I personally reviewed the CT images on her scan performed today.  The ascending aorta has a fusiform dilatation stable at 4.5 cm.  Impression: Risk for aortic tear or dissection is less than 2% and further conservative management with serial scans and blood pressure control is the best long-term therapy.  Plan: Return for follow-up surveillance scan in 1 year - 18 months.  She will call us sooner if she develops any chest pain.   Len Childs, MD [facility] [phone]

## 2020-01-17 ENCOUNTER — Other Ambulatory Visit: Payer: Medicare HMO

## 2020-01-23 NOTE — Progress Notes (Signed)
CARDIOLOGY OFFICE NOTE  Date:  01/31/2020    Zoe Andrade Date of Birth: Dec 27, 1940 Medical Record #[id number]  PCP:  Bonnita Nasuti, MD  Cardiologist:  Marisa Cyphers  Chief Complaint  Patient presents with  . Follow-up    Seen for Dr. Marlou Porch    History of Present Illness: Zoe Andrade is a 79 y.o. female who presents today for a 6 month check. Seen for Dr. Marlou Porch.   She was seen here back in [place] 2063forevaluation of paroxysmal atrial fibrillation at the request of Dr.Sethi. [facility] pal[facility]ations and sweating and was diagnosed with new onset atrial fibrillation. She was started on both Eliquis and aspirin. It was recommended that she discontinue the aspirin and stay on Eliquis alone.  CT scan of chest showed 43 mm ascending root aortic aneurysm. There was also an incidental finding of potential meningioma at T9.Shewas referred over to [facility].  In October of 2019 - she started having lots of issues with her BP and associated medicines. Her Plendil had been stopped due to swelling. She ended up requiring multiple agents. Had renal doppler as well - this has shown 1 to 59% narrowing on the right. Her cuff has been noted to run a little higher.At our visit in February - she was back off prior medicines and back on her Plendil. BP was doing ok.   I last saw her in August - she saw Dr. Marlou Porch in February - she was in AF with a rate of 55. Remains on Eliquis. CT showing 43 mm ascending aorta dilatation. Was off her statin - was willing to try Crestor. Otherwise, felt to be doing ok.   Comes in today. Here alone. She has stopped the Crestor - made it feel like her balance was off and she was going to fall. Sounds like she was tried on another agent and had the same reaction - now off of any cholesterol medicine. BP little soft here today - has had her medicines today. BP in the 120's at home usually. Having another great grand baby  - this makes her happy. She has been up to [place]. She has had her glasses changed and this has affected her balance and she is going back to her old set. Her weight is down. Not really aware of this. Walking a mile a day.  No chest pain. Breathing is fine. Not dizzy. No palpitations or AF noted. She has been vaccinated for COVID 19. Overall she feels like she is ok.    Past Medical History:  Diagnosis Date  . Ascending aorta dilation ([place]) 10/26/2019  . Cancer ([place])   . Diastolic dysfunction   . Hyperlipidemia 10/21/2017  . Hypertension   . 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 Paroxysmal atrial fibrillation ([place]) 04/21/2017  . 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 MASTECTOMY Right   . TEE WITHOUT CARDIOVERSION N/A 03/02/2017   Procedure: TRANSESOPHAGEAL ECHOCARDIOGRAM (TEE);  Surgeon: Dorothy Spark, MD;  Location: [facility] ENDOSCOPY;  Service: Cardiovascular;  Laterality: N/A;     Medications: Current Meds  Medication Sig  . atenolol (TENORMIN) 25 MG tablet Take 1 tablet (25 mg total) by mouth daily.  . cholecalciferol (VITAMIN D3) 25 MCG (1000 UT) tablet Take 2,000 Units by mouth 2 (two) times daily.   Marland Kitchen ELIQUIS 5 MG TABS tablet TAKE 1 TABLET TWICE A DAY  . felodipine (PLENDIL) 10  MG 24 hr tablet Take 1 tablet (10 mg total) by mouth daily.  . Omega-3 Fatty Acids (FISH OIL) 1000 MG CAPS Take 2,000 mg by mouth daily.     Allergies: No Known Allergies  Social History: The patient  reports that she has never smoked. She has never used smokeless tobacco. She reports that she does not drink alcohol and does not use drugs.   Family History: The patient's family history includes Breast cancer in her sister; Congestive Heart Failure in her father; Heart attack (age of onset: 67) in her brother; High blood pressure in her daughter, father, and mother; Hyperthyroidism in her sister.   Review of Systems: Please see the history of  present illness.   All other systems are reviewed and negative.   Physical Exam: VS:  BP 100/70   Pulse (!) 59   Ht 5\' 2"  (1.575 m)   Wt 151 lb 3.2 oz (68.6 kg)   LMP  (LMP Unknown)   SpO2 98%   BMI 27.65 kg/m  .  BMI Body mass index is 27.65 kg/m.  Wt Readings from Last 3 Encounters:  01/31/20 151 lb 3.2 oz (68.6 kg)  10/26/19 153 lb 8 oz (69.6 kg)  08/03/19 158 lb (71.7 kg)   BP is 110/70 by me.   General: Alert and in no acute distress.  Her weight is down 7 pounds since last visit here.  Cardiac: Irregular rhythm - her rate is fine. No murmurs, rubs, or gallops. No edema.  Respiratory:  Lungs are clear to auscultation bilaterally with normal work of breathing.  GI: Soft and nontender.  MS: No deformity or atrophy. Gait and ROM intact.  Skin: Warm and dry. Color is normal.  Neuro:  Strength and sensation are intact and no gross focal deficits noted.  Psych: Alert, appropriate and with normal affect.   LABORATORY DATA:  EKG:  EKG is not ordered today.    Lab Results  Component Value Date   WBC 8.4 02/14/2019   HGB 14.5 02/14/2019   HCT 42.1 02/14/2019   PLT 214 02/14/2019   GLUCOSE 89 02/14/2019   CHOL 225 (H) 02/14/2019   TRIG 126 02/14/2019   HDL 70 02/14/2019   LDLCALC 133 (H) 02/14/2019   ALT 65 (H) 02/14/2019   AST 72 (H) 02/14/2019   NA 141 02/14/2019   K 4.4 02/14/2019   CL 105 02/14/2019   CREATININE 0.74 02/14/2019   BUN 10 02/14/2019   CO2 22 02/14/2019   INR 0.92 02/25/2017   HGBA1C 5.3 02/26/2017     BNP (last 3 results) No results for input(s): BNP in the last 8760 hours.  ProBNP (last 3 results) No results for input(s): PROBNP in the last 8760 hours.   Other Studies Reviewed Today:  RenalDuplex 04/2018:  Right: Normal size right kidney. Normal right Resistive Index. Normal cortical thickness of right kidney. 1-59% stenosis of the right renal artery. RRV flow present. Probable simple cyst at the mid pole,  measuring 2.4 x 1.4 x 2.2 cm. Left: Normal size of left kidney. Normal left Resistive Index. Normal cortical thickness of the left kidney. No evidence of left renal artery stenosis. LRV flow present. Mesenteric: Normal Celiac artery and Superior Mesenteric artery findings.  Patent IVC.   TTE9/2018 - Left ventricle: The cavity size was normal. Systolic function was normal. The estimated ejection fraction was in the range of 60%to 65%. Wall motion was normal; there were no regional wallmotion abnormalities. Doppler parameters are consistent with  abnormal left ventricular relaxation (grade 1 diastolic dysfunction). - Left atrium: The atrium was mildly dilated. Anterior-posterior dimension: 41 mm. Impressions: - No cardiac source of emboli was indentified.  Lower Extremity US (DVT) 02/28/2017 No evidence of deep vein or superficial thrombosis involving the right lower extremity and left lower extremity.  TEE 03/02/2017 Left Ventrical:LVEF 60-65% Mitral Valve:No MR Aortic Valve:No AI Tricuspid Valve: Mild TR Pulmonic Valve: No PR Left Atrium/ Left atrial appendage: No thrombus, normal emptying and filling velocities. Atrial septum:Hypermobile, lipomatous hypertrophy of the interatrial septum. No PFO, negative bubble study. Aorta:Severe non-mobile atheroma in the descending thoracic aorta. Ascending aorta is dilated measuring 42-43 mm, a dissection can't be excluded on current images, a dedicated chest CTA will be ordered, this was discussed with Dr Leonie Man, the patient and her daughter. They agree. She will be referred to CV surgery and to me for a follow up.    Assessment/Plan:  1. Persistent AF - her rate is controlled - she is not symptomatic. Remains on Eliquis. No problems noted. Would continue low dose Atenolol.   2. HTN - BP is great - would stay on Atenolol and Plendil.   3. Chronic anticoagulation - recheck lab today. No problems noted.  She has had some balance issues but feels this is more related to the change in glasses.   4. Aortic atherosclerosis - risk factor modification encouraged. She is walking daily.   5. HLD - has not tolerated Crestor - may try Zetia. Lab today.   6. Ascending aortic aneurysm - followed by [facility] - last seen in May and study was stable. Has great BP control.   7. Prior stroke with nice recovery. Remains on anticoagulation.   Current medicines are reviewed with the patient today.  The patient does not have concerns regarding medicines other than what has been noted above.  The following changes have been made:  See above.  Labs/ tests ordered today include:    Orders Placed This Encounter  Procedures  . Basic metabolic panel  . CBC  . Hepatic function panel  . Lipid panel     Disposition:   FU with [place] in 6 months. Labs today.    Patient is agreeable to this plan and will call if any problems develop in the interim.   SignedTruitt Merle, NP  01/31/2020 10:43 AM  [facility] [address] [place], [place]  [address] Phone: [phone] Fax: [phone]

## 2020-01-31 ENCOUNTER — Ambulatory Visit: Payer: Medicare HMO | Admitting: Nurse Practitioner

## 2020-01-31 ENCOUNTER — Encounter: Payer: Self-pay | Admitting: Nurse Practitioner

## 2020-01-31 ENCOUNTER — Other Ambulatory Visit: Payer: Self-pay

## 2020-01-31 VITALS — BP 100/70 | HR 59 | Ht 62.0 in | Wt 151.2 lb

## 2020-01-31 DIAGNOSIS — I10 Essential (primary) hypertension: Secondary | ICD-10-CM | POA: Diagnosis not present

## 2020-01-31 DIAGNOSIS — Z7901 Long term (current) use of anticoagulants: Secondary | ICD-10-CM

## 2020-01-31 DIAGNOSIS — E7800 Pure hypercholesterolemia, unspecified: Secondary | ICD-10-CM | POA: Diagnosis not present

## 2020-01-31 DIAGNOSIS — I712 Thoracic aortic aneurysm, without rupture: Secondary | ICD-10-CM

## 2020-01-31 DIAGNOSIS — Z79899 Other long term (current) drug therapy: Secondary | ICD-10-CM

## 2020-01-31 DIAGNOSIS — I480 Paroxysmal atrial fibrillation: Secondary | ICD-10-CM | POA: Diagnosis not present

## 2020-01-31 DIAGNOSIS — I7121 Aneurysm of the ascending aorta, without rupture: Secondary | ICD-10-CM

## 2020-01-31 LAB — CBC
Hematocrit: 43.2 % (ref 34.0–46.6)
Hemoglobin: 14.9 g/dL (ref 11.1–15.9)
MCH: 30.3 pg (ref 26.6–33.0)
MCHC: 34.5 g/dL (ref 31.5–35.7)
MCV: 88 fL (ref 79–97)
Platelets: 206 10*3/uL (ref 150–450)
RBC: 4.91 x10E6/uL (ref 3.77–5.28)
RDW: 13.4 % (ref 11.7–15.4)
WBC: 9.7 10*3/uL (ref 3.4–10.8)

## 2020-01-31 LAB — BASIC METABOLIC PANEL
BUN/Creatinine Ratio: 14 (ref 12–28)
BUN: 13 mg/dL (ref 8–27)
CO2: 22 mmol/L (ref 20–29)
Calcium: 10.3 mg/dL (ref 8.7–10.3)
Chloride: 101 mmol/L (ref 96–106)
Creatinine, Ser: 0.91 mg/dL (ref 0.57–1.00)
GFR calc Af Amer: 70 mL/min/{1.73_m2} (ref >59)
GFR calc non Af Amer: 61 mL/min/{1.73_m2} (ref >59)
Glucose: 91 mg/dL (ref 65–99)
Potassium: 4.4 mmol/L (ref 3.5–5.2)
Sodium: 137 mmol/L (ref 134–144)

## 2020-01-31 LAB — HEPATIC FUNCTION PANEL
ALT: 12 IU/L (ref 0–32)
AST: 16 IU/L (ref 0–40)
Albumin: 4.7 g/dL (ref 3.7–4.7)
Alkaline Phosphatase: 115 IU/L (ref 48–121)
Bilirubin Total: 0.6 mg/dL (ref 0.0–1.2)
Bilirubin, Direct: 0.15 mg/dL (ref 0.00–0.40)
Total Protein: 7.5 g/dL (ref 6.0–8.5)

## 2020-01-31 LAB — LIPID PANEL
Chol/HDL Ratio: 4.1 ratio (ref 0.0–4.4)
Cholesterol, Total: 222 mg/dL — ABNORMAL HIGH (ref 100–199)
HDL: 54 mg/dL (ref >39)
LDL Chol Calc (NIH): 125 mg/dL — ABNORMAL HIGH (ref 0–99)
Triglycerides: 248 mg/dL — ABNORMAL HIGH (ref 0–149)
VLDL Cholesterol Cal: 43 mg/dL — ABNORMAL HIGH (ref 5–40)

## 2020-01-31 NOTE — Patient Instructions (Addendum)
After Visit Summary:  We will be checking the following labs today - BMET, CBC, HPF and Lipids   Medication Instructions:    Continue with your current medicines.   We will see what your cholesterol levels show - we may try Zetia - we will let you know about this in the next day or so   If you need a refill on your cardiac medications before your next appointment, please call your pharmacy.     Testing/Procedures To Be Arranged:  N/A  Follow-Up:   See [place] in 6 months -  You will receive a reminder letter in the mail two months in advance. If you don't receive a letter, please call our office to schedule the follow-up appointment.     At [facility], you and your health needs are our priority.  As part of our continuing mission to provide you with exceptional heart care, we have created designated Provider Care Teams.  These Care Teams include your primary Cardiologist (physician) and Advanced Practice Providers (APPs -  Physician Assistants and Nurse Practitioners) who all work together to provide you with the care you need, when you need it.  Special Instructions:  . Stay safe, wash your hands for at least 20 seconds and wear a mask when needed.  . It was good to talk with you today.  Marland Kitchen Keep up the good work!   Call the [place] office at [phone] if you have any questions, problems or concerns.

## 2020-02-06 ENCOUNTER — Other Ambulatory Visit: Payer: Self-pay | Admitting: *Deleted

## 2020-02-06 DIAGNOSIS — E7800 Pure hypercholesterolemia, unspecified: Secondary | ICD-10-CM

## 2020-02-06 MED ORDER — EZETIMIBE 10 MG PO TABS: 10.0000 mg | ORAL_TABLET | Freq: Every day | ORAL | 3 refills | Status: AC

## 2020-03-19 ENCOUNTER — Other Ambulatory Visit: Payer: Self-pay

## 2020-03-19 ENCOUNTER — Other Ambulatory Visit: Payer: Medicare HMO | Admitting: *Deleted

## 2020-03-19 DIAGNOSIS — E7800 Pure hypercholesterolemia, unspecified: Secondary | ICD-10-CM

## 2020-03-19 LAB — HEPATIC FUNCTION PANEL
ALT: 12 IU/L (ref 0–32)
AST: 18 IU/L (ref 0–40)
Albumin: 4.5 g/dL (ref 3.7–4.7)
Alkaline Phosphatase: 116 IU/L (ref 44–121)
Bilirubin Total: 0.7 mg/dL (ref 0.0–1.2)
Bilirubin, Direct: 0.16 mg/dL (ref 0.00–0.40)
Total Protein: 7.4 g/dL (ref 6.0–8.5)

## 2020-03-19 LAB — LIPID PANEL
Chol/HDL Ratio: 3.5 ratio (ref 0.0–4.4)
Cholesterol, Total: 204 mg/dL — ABNORMAL HIGH (ref 100–199)
HDL: 59 mg/dL (ref >39)
LDL Chol Calc (NIH): 123 mg/dL — ABNORMAL HIGH (ref 0–99)
Triglycerides: 127 mg/dL (ref 0–149)
VLDL Cholesterol Cal: 22 mg/dL (ref 5–40)

## 2020-03-20 ENCOUNTER — Telehealth: Payer: Self-pay | Admitting: *Deleted

## 2020-03-20 NOTE — Telephone Encounter (Signed)
-----   Message from [name], NP sent at 03/20/2020 10:11 AM EDT ----- Try to take more consistently for now - can recheck her labs in 6 months.

## 2020-03-20 NOTE — Telephone Encounter (Signed)
S/w pt is aware of Lori's recommendations.  Fasting labs added to recall in 6 months.

## 2020-04-09 ENCOUNTER — Other Ambulatory Visit: Payer: Self-pay | Admitting: Pharmacist

## 2020-04-09 MED ORDER — APIXABAN 5 MG PO TABS: 5.0000 mg | ORAL_TABLET | Freq: Two times a day (BID) | ORAL | 1 refills | Status: DC

## 2020-04-09 NOTE — Progress Notes (Signed)
Age 79, weight 68kg, SCr 0.91 on 01/31/20 afib indication, last visit Aug 2021

## 2020-07-03 ENCOUNTER — Other Ambulatory Visit: Payer: Self-pay | Admitting: Nurse Practitioner

## 2020-07-30 ENCOUNTER — Ambulatory Visit: Payer: Medicare HMO | Admitting: Cardiology

## 2020-07-30 ENCOUNTER — Other Ambulatory Visit: Payer: Self-pay

## 2020-07-30 ENCOUNTER — Encounter: Payer: Self-pay | Admitting: Cardiology

## 2020-07-30 VITALS — BP 100/70 | HR 44 | Ht 62.0 in | Wt 156.0 lb

## 2020-07-30 DIAGNOSIS — I480 Paroxysmal atrial fibrillation: Secondary | ICD-10-CM

## 2020-07-30 DIAGNOSIS — Z79899 Other long term (current) drug therapy: Secondary | ICD-10-CM

## 2020-07-30 DIAGNOSIS — Z7901 Long term (current) use of anticoagulants: Secondary | ICD-10-CM

## 2020-07-30 DIAGNOSIS — I10 Essential (primary) hypertension: Secondary | ICD-10-CM

## 2020-07-30 LAB — BASIC METABOLIC PANEL
BUN/Creatinine Ratio: 20 (ref 12–28)
BUN: 17 mg/dL (ref 8–27)
CO2: 21 mmol/L (ref 20–29)
Calcium: 9.5 mg/dL (ref 8.7–10.3)
Chloride: 101 mmol/L (ref 96–106)
Creatinine, Ser: 0.86 mg/dL (ref 0.57–1.00)
GFR calc Af Amer: 74 mL/min/{1.73_m2} (ref >59)
GFR calc non Af Amer: 64 mL/min/{1.73_m2} (ref >59)
Glucose: 91 mg/dL (ref 65–99)
Potassium: 4.5 mmol/L (ref 3.5–5.2)
Sodium: 140 mmol/L (ref 134–144)

## 2020-07-30 LAB — CBC
Hematocrit: 43.5 % (ref 34.0–46.6)
Hemoglobin: 14.3 g/dL (ref 11.1–15.9)
MCH: 29.2 pg (ref 26.6–33.0)
MCHC: 32.9 g/dL (ref 31.5–35.7)
MCV: 89 fL (ref 79–97)
Platelets: 205 10*3/uL (ref 150–450)
RBC: 4.9 x10E6/uL (ref 3.77–5.28)
RDW: 13.5 % (ref 11.7–15.4)
WBC: 10.3 10*3/uL (ref 3.4–10.8)

## 2020-07-30 NOTE — Progress Notes (Signed)
Cardiology Office Note:    Date:  07/30/2020   ID:  Deeandra, Jerry March 05, 1941, MRN [id number]  PCP:  Bonnita Nasuti, MD   [place]  Cardiologist:  Candee Furbish, MD  Advanced Practice Provider:  No care team member to display Electrophysiologist:  None       Referring MD: Bonnita Nasuti, MD    History of Present Illness:    Zoe Andrade is a 80 y.o. female here for follow-up dilated aortic root 45 mm ascending on CT scan, hypertension, prior paroxysmal atrial fibrillation.  She is asymptomatic.  She has seen Dr. Darcey Nora last on 10/26/2019.  She continues to follow with CT surgery.  Today she has atrial fibrillation again, bradycardic mostly at home in the 40s. Walking well daily.  No syncope no chest pain no shortness of breath.  Feels well.  Past Medical History:  Diagnosis Date  . Ascending aorta dilation ([place]) 10/26/2019  . Cancer ([place])   . Diastolic dysfunction   . Hyperlipidemia 10/21/2017  . Hypertension   . 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 Paroxysmal atrial fibrillation ([place]) 04/21/2017  . 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 MASTECTOMY Right   . TEE WITHOUT CARDIOVERSION N/A 03/02/2017   Procedure: TRANSESOPHAGEAL ECHOCARDIOGRAM (TEE);  Surgeon: Dorothy Spark, MD;  Location: [facility] ENDOSCOPY;  Service: Cardiovascular;  Laterality: N/A;    Current Medications: Current Meds  Medication Sig  . apixaban (ELIQUIS) 5 MG TABS tablet Take 1 tablet (5 mg total) by mouth 2 (two) times daily.  . cholecalciferol (VITAMIN D3) 25 MCG (1000 UT) tablet Take 2,000 Units by mouth 2 (two) times daily.   Marland Kitchen ezetimibe (ZETIA) 10 MG tablet Take 1 tablet (10 mg total) by mouth daily.  . felodipine (PLENDIL) 10 MG 24 hr tablet Take 1 tablet (10 mg total) by mouth daily.  . Omega-3 Fatty Acids (FISH OIL) 1000 MG CAPS Take 2,000 mg by mouth daily.  . [DISCONTINUED] atenolol (TENORMIN) 25 MG  tablet Take 1 tablet by mouth once daily     Allergies:   Patient has no known allergies.   Social History   Socioeconomic History  . Marital status: Widowed    Spouse name: Not on file  . Number of children: Not on file  . Years of education: Not on file  . Highest education level: Not on file  Occupational History  . Not on file  Tobacco Use  . Smoking status: Never Smoker  . Smokeless tobacco: Never Used  Vaping Use  . Vaping Use: Unknown  Substance and Sexual Activity  . Alcohol use: No    Comment: uta  . Drug use: No  . Sexual activity: Not on file  Other Topics Concern  . Not on file  Social History Narrative  . Not on file   Social Determinants of Health   Financial Resource Strain: Not on file  Food Insecurity: Not on file  Transportation Needs: Not on file  Physical Activity: Not on file  Stress: Not on file  Social Connections: Not on file     Family History: The patient's family history includes Breast cancer in her sister; Congestive Heart Failure in her father; Heart attack (age of onset: 82) in her brother; High blood pressure in her daughter, father, and mother; Hyperthyroidism in her sister.  ROS:   Please see the history of present illness.     All other  systems reviewed and are negative.  EKGs/Labs/Other Studies Reviewed:     EKG:  EKG is  ordered today.  The ekg ordered today demonstrates atrial fibrillation 44 bpm no changes  Recent Labs: 01/31/2020: BUN 13; Creatinine, Ser 0.91; Hemoglobin 14.9; Platelets 206; Potassium 4.4; Sodium 137 03/19/2020: ALT 12  Recent Lipid Panel    Component Value Date/Time   CHOL 204 (H) 03/19/2020 0903   TRIG 127 03/19/2020 0903   HDL 59 03/19/2020 0903   CHOLHDL 3.5 03/19/2020 0903   CHOLHDL 3.6 02/26/2017 0208   VLDL 20 02/26/2017 0208   LDLCALC 123 (H) 03/19/2020 0903     Risk Assessment/Calculations:    CHA2DS2-VASc Score = 5  This indicates a 7.2% annual risk of stroke. The patient's score  is based upon: CHF History: No HTN History: Yes Diabetes History: No Stroke History: No Vascular Disease History: Yes Age Score: 2 Gender Score: 1      Physical Exam:    VS:  BP 100/70 (BP Location: Left Arm, Patient Position: Sitting, Cuff Size: Normal)   Pulse (!) 44   Ht 5\' 2"  (1.575 m)   Wt 156 lb (70.8 kg)   LMP  (LMP Unknown)   SpO2 96%   BMI 28.53 kg/m     Wt Readings from Last 3 Encounters:  07/30/20 156 lb (70.8 kg)  01/31/20 151 lb 3.2 oz (68.6 kg)  10/26/19 153 lb 8 oz (69.6 kg)     GEN:  Well nourished, well developed in no acute distress HEENT: Normal NECK: No JVD; No carotid bruits LYMPHATICS: No lymphadenopathy CARDIAC: brady irreg, no murmurs, rubs, gallops RESPIRATORY:  Clear to auscultation without rales, wheezing or rhonchi  ABDOMEN: Soft, non-tender, non-distended MUSCULOSKELETAL:  No edema; No deformity  SKIN: Warm and dry NEUROLOGIC:  Alert and oriented x 3 PSYCHIATRIC:  Normal affect   ASSESSMENT:    1. PAF (paroxysmal atrial fibrillation) ([place])   2. Essential hypertension   3. Medication management   4. Chronic anticoagulation    PLAN:    In order of problems listed above:  Persistent atrial relation -Today she is in atrial fibrillation with heart rate of 44 bpm, asymptomatic.  Prior EKG showed A. fib 55 bpm.  At home her blood pressure cuff is showing 40s mostly.  We will go ahead and stop her atenolol 25 mg.  She understands to contact us if she has syncope etc.  Would require pacemaker if that occurred.  Dilated aortic root -Ascending aorta 4.5 cm.  Recent CT reviewed.  Essential hypertension -Continue with felodipine.   -Stopping atenolol because of bradycardia.  We will check CBC and a basic metabolic profile.     We will see her back in 6 months.   Medication Adjustments/Labs and Tests Ordered: Current medicines are reviewed at length with the patient today.  Concerns regarding medicines are outlined above.  Orders  Placed This Encounter  Procedures  . CBC  . Basic metabolic panel  . EKG 12-Lead   No orders of the defined types were placed in this encounter.   Patient Instructions  Medication Instructions:  Please discontinue your Atenolol.  Continue all other medications as listed.  *If you need a refill on your cardiac medications before your next appointment, please call your pharmacy*  Lab Work: Please have blood work today (CBC, BMP)  If you have labs (blood work) drawn today and your tests are completely normal, you will receive your results only by: Marland Kitchen MyChart Message (if you have  MyChart) OR . A paper copy in the mail If you have any lab test that is abnormal or we need to change your treatment, we will call you to review the results.  Follow-Up: At [facility], you and your health needs are our priority.  As part of our continuing mission to provide you with exceptional heart care, we have created designated Provider Care Teams.  These Care Teams include your primary Cardiologist (physician) and Advanced Practice Providers (APPs -  Physician Assistants and Nurse Practitioners) who all work together to provide you with the care you need, when you need it.  We recommend signing up for the patient portal called "MyChart".  Sign up information is provided on this After Visit Summary.  MyChart is used to connect with patients for Virtual Visits (Telemedicine).  Patients are able to view lab/test results, encounter notes, upcoming appointments, etc.  Non-urgent messages can be sent to your provider as well.   To learn more about what you can do with MyChart, go to [email or web address].    Your next appointment:   6 month(s)  The format for your next appointment:   In Person  Provider:   Candee Furbish, MD   Thank you for choosing [facility]!!        Signed, Candee Furbish, MD  07/30/2020 11:56 AM    [place]

## 2020-07-30 NOTE — Patient Instructions (Signed)
Medication Instructions:  Please discontinue your Atenolol.  Continue all other medications as listed.  *If you need a refill on your cardiac medications before your next appointment, please call your pharmacy*  Lab Work: Please have blood work today (CBC, BMP)  If you have labs (blood work) drawn today and your tests are completely normal, you will receive your results only by: Marland Kitchen MyChart Message (if you have MyChart) OR . A paper copy in the mail If you have any lab test that is abnormal or we need to change your treatment, we will call you to review the results.  Follow-Up: At [facility], you and your health needs are our priority.  As part of our continuing mission to provide you with exceptional heart care, we have created designated Provider Care Teams.  These Care Teams include your primary Cardiologist (physician) and Advanced Practice Providers (APPs -  Physician Assistants and Nurse Practitioners) who all work together to provide you with the care you need, when you need it.  We recommend signing up for the patient portal called "MyChart".  Sign up information is provided on this After Visit Summary.  MyChart is used to connect with patients for Virtual Visits (Telemedicine).  Patients are able to view lab/test results, encounter notes, upcoming appointments, etc.  Non-urgent messages can be sent to your provider as well.   To learn more about what you can do with MyChart, go to [email or web address].    Your next appointment:   6 month(s)  The format for your next appointment:   In Person  Provider:   Candee Furbish, MD   Thank you for choosing [facility]!!

## 2020-07-31 ENCOUNTER — Encounter: Payer: Self-pay | Admitting: *Deleted

## 2020-08-07 ENCOUNTER — Telehealth: Payer: Self-pay | Admitting: Cardiology

## 2020-08-07 NOTE — Telephone Encounter (Signed)
Pt c/o BP issue: STAT if pt c/o blurred vision, one-sided weakness or slurred speech  1. What are your last 5 BP readings?  146/86 - 54 133/86 - 45 173/105   142/77  2. Are you having any other symptoms (ex. Dizziness, headache, blurred vision, passed out)? No   3. What is your BP issue?   Patient states her BP has been elevated ever since she was advised to discontinue her Atenolol.

## 2020-08-07 NOTE — Telephone Encounter (Signed)
Called patient about her message. Patient complaining of elevated BP. Patient is not having any symptoms with elevated BP. Patient decided to start back on her atenolol, that Dr. Marlou Porch discontinued due to patient being bradycardia (HR 40's and BP low at office visit). Patient stated her HR is in the 50's, but on her message, patient reported HR still in the 40's at 50. Tried to explain to patient the reason for Dr. Marlou Porch stopping the atenolol. Patient stated she is going to continue taking atenolol. Informed patient that there are other medications out there that can lower BP without lowering the HR. Informed patient that a message would be sent to Dr. Marlou Porch.

## 2020-08-08 ENCOUNTER — Encounter: Payer: Self-pay | Admitting: Cardiology

## 2020-08-08 ENCOUNTER — Other Ambulatory Visit: Payer: Self-pay

## 2020-08-08 ENCOUNTER — Ambulatory Visit: Payer: Medicare HMO | Admitting: Cardiology

## 2020-08-08 VITALS — BP 110/70 | HR 60 | Ht 62.0 in | Wt 157.0 lb

## 2020-08-08 DIAGNOSIS — I480 Paroxysmal atrial fibrillation: Secondary | ICD-10-CM | POA: Diagnosis not present

## 2020-08-08 DIAGNOSIS — I10 Essential (primary) hypertension: Secondary | ICD-10-CM

## 2020-08-08 DIAGNOSIS — Z7901 Long term (current) use of anticoagulants: Secondary | ICD-10-CM | POA: Diagnosis not present

## 2020-08-08 NOTE — Progress Notes (Signed)
Cardiology Office Note:    Date:  08/08/2020   ID:  Zoe, Andrade [birth year], MRN [id number]  PCP:  Bonnita Nasuti, MD   [place]  Cardiologist:  Candee Furbish, MD  Advanced Practice Provider:  No care team member to display Electrophysiologist:  None       Referring MD: Bonnita Nasuti, MD    History of Present Illness:    Zoe Andrade is a 80 y.o. 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  No chest pain no shortness of breath.  Heart rate at last visit was 44 bpm.  EKG showed 55.  At home blood pressure cuff was in the 40s.  We asked her to stop her atenolol 25 mg.  When she stopped her atenolol, her blood pressure skyrocketed she stated.  She restarted her atenolol and came back down to normal.  Her blood pressures have been fine at home.  Her heart rate has been in the 50s mostly at home. Overall she is asymptomatic feeling well.  Past Medical History:  Diagnosis Date  . 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 Cancer ([place])   . Diastolic dysfunction   . Hyperlipidemia 10/21/2017  . Hypertension   . 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 Paroxysmal atrial fibrillation ([place]) 04/21/2017  . 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 MASTECTOMY Right   . TEE WITHOUT CARDIOVERSION N/A 03/02/2017   Procedure: TRANSESOPHAGEAL ECHOCARDIOGRAM (TEE);  Surgeon: Dorothy Spark, MD;  Location: [facility] ENDOSCOPY;  Service: Cardiovascular;  Laterality: N/A;    Current Medications: Current Meds  Medication Sig  . apixaban (ELIQUIS) 5 MG TABS tablet Take 1 tablet (5 mg total) by mouth 2 (two) times daily.  Marland Kitchen atenolol (TENORMIN) 25 MG tablet Take 25 mg by mouth daily.  . cholecalciferol (VITAMIN D3) 25 MCG (1000 UT) tablet Take 2,000 Units by mouth 2 (two) times daily.   Marland Kitchen ezetimibe (ZETIA) 10 MG tablet Take 1 tablet (10 mg total) by mouth daily.  .  felodipine (PLENDIL) 10 MG 24 hr tablet Take 1 tablet (10 mg total) by mouth daily.  . Omega-3 Fatty Acids (FISH OIL) 1000 MG CAPS Take 2,000 mg by mouth daily.     Allergies:   Patient has no known allergies.   Social History   Socioeconomic History  . Marital status: Widowed    Spouse name: Not on file  . Number of children: Not on file  . Years of education: Not on file  . Highest education level: Not on file  Occupational History  . Not on file  Tobacco Use  . Smoking status: Never Smoker  . Smokeless tobacco: Never Used  Vaping Use  . Vaping Use: Unknown  Substance and Sexual Activity  . Alcohol use: No    Comment: uta  . Drug use: No  . Sexual activity: Not on file  Other Topics Concern  . Not on file  Social History Narrative  . Not on file   Social Determinants of Health   Financial Resource Strain: Not on file  Food Insecurity: Not on file  Transportation Needs: Not on file  Physical Activity: Not on file  Stress: Not on file  Social Connections: Not on file     Family History: The patient's family history includes Breast cancer in her sister; Congestive Heart Failure in her father; Heart attack (age of onset: 19)  in her brother; High blood pressure in her daughter, father, and mother; Hyperthyroidism in her sister.  ROS:   Please see the history of present illness.     All other systems reviewed and are negative.  EKGs/Labs/Other Studies Reviewed:     Recent Labs: 03/19/2020: ALT 12 07/30/2020: BUN 17; Creatinine, Ser 0.86; Hemoglobin 14.3; Platelets 205; Potassium 4.5; Sodium 140  Recent Lipid Panel    Component Value Date/Time   CHOL 204 (H) 03/19/2020 0903   TRIG 127 03/19/2020 0903   HDL 59 03/19/2020 0903   CHOLHDL 3.5 03/19/2020 0903   CHOLHDL 3.6 02/26/2017 0208   VLDL 20 02/26/2017 0208   LDLCALC 123 (H) 03/19/2020 0903     Risk Assessment/Calculations:      Physical Exam:    VS:  BP 110/70 (BP Location: Left Arm, Patient  Position: Sitting, Cuff Size: Normal)   Pulse 60   Ht 5\' 2"  (1.575 m)   Wt 157 lb (71.2 kg)   LMP  (LMP Unknown)   SpO2 97%   BMI 28.72 kg/m     Wt Readings from Last 3 Encounters:  08/08/20 157 lb (71.2 kg)  07/30/20 156 lb (70.8 kg)  01/31/20 151 lb 3.2 oz (68.6 kg)     GEN:  Well nourished, well developed in no acute distress HEENT: Normal NECK: No JVD; No carotid bruits LYMPHATICS: No lymphadenopathy CARDIAC: irreg brady, no murmurs, rubs, gallops RESPIRATORY:  Clear to auscultation without rales, wheezing or rhonchi  ABDOMEN: Soft, non-tender, non-distended MUSCULOSKELETAL:  No edema; No deformity  SKIN: Warm and dry NEUROLOGIC:  Alert and oriented x 3 PSYCHIATRIC:  Normal affect   ASSESSMENT:    1. PAF (paroxysmal atrial fibrillation) ([place])   2. Essential hypertension   3. Chronic anticoagulation    PLAN:    In order of problems listed above:  Asymptomatic bradycardia with atrial fibrillation -We will go ahead and continue with her atenolol 25 mg.  Obviously if she becomes symptomatic or if her pulse becomes dramatically lower, we will need to stop and add another type of agent such as irbesartan.  Continue with felodipine.  Hyperlipidemia -Continue with Zetia and fish oil.  Chronic anticoagulation with atrial fibrillation -Eliquis 5 mg.  No changes made.       Medication Adjustments/Labs and Tests Ordered: Current medicines are reviewed at length with the patient today.  Concerns regarding medicines are outlined above.  No orders of the defined types were placed in this encounter.  No orders of the defined types were placed in this encounter.   Patient Instructions  Medication Instructions:   Your physician recommends that you continue on your current medications as directed. Please refer to the Current Medication list given to you today.  *If you need a refill on your cardiac medications before your next appointment, please call your  pharmacy*   Follow-Up: At [facility], you and your health needs are our priority.  As part of our continuing mission to provide you with exceptional heart care, we have created designated Provider Care Teams.  These Care Teams include your primary Cardiologist (physician) and Advanced Practice Providers (APPs -  Physician Assistants and Nurse Practitioners) who all work together to provide you with the care you need, when you need it.  We recommend signing up for the patient portal called "MyChart".  Sign up information is provided on this After Visit Summary.  MyChart is used to connect with patients for Virtual Visits (Telemedicine).  Patients are able to  view lab/test results, encounter notes, upcoming appointments, etc.  Non-urgent messages can be sent to your provider as well.   To learn more about what you can do with MyChart, go to [email or web address].    Your next appointment:   6 month(s)  The format for your next appointment:   In Person  Provider:   Candee Furbish, MD       Signed, Candee Furbish, MD  08/08/2020 2:50 PM    [place]

## 2020-08-08 NOTE — Telephone Encounter (Signed)
Please have her come in to see me or APP Thanks Candee Furbish, MD

## 2020-08-08 NOTE — Patient Instructions (Addendum)
Medication Instructions:   Your physician recommends that you continue on your current medications as directed. Please refer to the Current Medication list given to you today.  *If you need a refill on your cardiac medications before your next appointment, please call your pharmacy*   Follow-Up: At [facility], you and your health needs are our priority.  As part of our continuing mission to provide you with exceptional heart care, we have created designated Provider Care Teams.  These Care Teams include your primary Cardiologist (physician) and Advanced Practice Providers (APPs -  Physician Assistants and Nurse Practitioners) who all work together to provide you with the care you need, when you need it.  We recommend signing up for the patient portal called "MyChart".  Sign up information is provided on this After Visit Summary.  MyChart is used to connect with patients for Virtual Visits (Telemedicine).  Patients are able to view lab/test results, encounter notes, upcoming appointments, etc.  Non-urgent messages can be sent to your provider as well.   To learn more about what you can do with MyChart, go to [email or web address].    Your next appointment:   6 month(s)  The format for your next appointment:   In Person  Provider:   Mark Skains, MD    

## 2020-08-08 NOTE — Telephone Encounter (Signed)
Patient has appt with Dr. Marlou Porch today.

## 2020-12-25 ENCOUNTER — Other Ambulatory Visit: Payer: Self-pay | Admitting: Cardiology

## 2021-01-29 ENCOUNTER — Telehealth: Payer: Self-pay | Admitting: Oncology

## 2021-01-29 NOTE — Telephone Encounter (Signed)
Patient called stating she Canceled her Mammo and 8/31 Follow Up w/Dr Hinton Rao.  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  She will call back to reschedule

## 2021-02-06 ENCOUNTER — Ambulatory Visit: Payer: Medicare HMO | Admitting: Physician Assistant

## 2021-02-13 ENCOUNTER — Ambulatory Visit: Payer: Medicare HMO | Admitting: Oncology

## 2021-03-22 ENCOUNTER — Other Ambulatory Visit: Payer: Self-pay | Admitting: Surgery

## 2021-03-22 DIAGNOSIS — I7121 Aneurysm of the ascending aorta, without rupture: Secondary | ICD-10-CM

## 2021-05-02 ENCOUNTER — Ambulatory Visit: Payer: Medicare HMO

## 2021-05-02 ENCOUNTER — Other Ambulatory Visit: Payer: Medicare HMO

## 2022-08-27 NOTE — Telephone Encounter (Signed)
done

## 2023-12-15 DEATH — deceased
